# Patient Record
Sex: Male | Born: 1965
Health system: Southern US, Community
[De-identification: ages and names within clinical notes are randomized; demographics above are authoritative.]

## PROBLEM LIST (undated history)

## (undated) DIAGNOSIS — F10939 Alcohol use, unspecified with withdrawal, unspecified: Secondary | ICD-10-CM

## (undated) DIAGNOSIS — H9319 Tinnitus, unspecified ear: Secondary | ICD-10-CM

## (undated) DIAGNOSIS — F329 Major depressive disorder, single episode, unspecified: Secondary | ICD-10-CM

## (undated) DIAGNOSIS — F10239 Alcohol dependence with withdrawal, unspecified: Secondary | ICD-10-CM

## (undated) DIAGNOSIS — F1020 Alcohol dependence, uncomplicated: Secondary | ICD-10-CM

## (undated) DIAGNOSIS — K219 Gastro-esophageal reflux disease without esophagitis: Secondary | ICD-10-CM

## (undated) DIAGNOSIS — F419 Anxiety disorder, unspecified: Secondary | ICD-10-CM

## (undated) DIAGNOSIS — F32A Depression, unspecified: Secondary | ICD-10-CM

## (undated) DIAGNOSIS — R45851 Suicidal ideations: Secondary | ICD-10-CM

## (undated) DIAGNOSIS — N529 Male erectile dysfunction, unspecified: Secondary | ICD-10-CM

## (undated) HISTORY — DX: Gastro-esophageal reflux disease without esophagitis: K21.9

## (undated) HISTORY — DX: Male erectile dysfunction, unspecified: N52.9

## (undated) HISTORY — DX: Alcohol dependence, uncomplicated: F10.20

## (undated) HISTORY — PX: LEG SURGERY: SHX1003

## (undated) HISTORY — DX: Alcohol dependence with withdrawal, unspecified: F10.239

## (undated) HISTORY — DX: Suicidal ideations: R45.851

## (undated) HISTORY — DX: Tinnitus, unspecified ear: H93.19

## (undated) HISTORY — DX: Alcohol use, unspecified with withdrawal, unspecified: F10.939

## (undated) HISTORY — PX: ROTATOR CUFF REPAIR: SHX139

## (undated) HISTORY — DX: Depression, unspecified: F32.A

## (undated) HISTORY — DX: Anxiety disorder, unspecified: F41.9

---
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
## 2002-09-21 ENCOUNTER — Ambulatory Visit (HOSPITAL_BASED_OUTPATIENT_CLINIC_OR_DEPARTMENT_OTHER): Admission: RE | Admit: 2002-09-21 | Discharge: 2002-09-21 | Payer: Self-pay | Admitting: Plastic Surgery

## 2005-02-09 ENCOUNTER — Observation Stay (HOSPITAL_COMMUNITY): Admission: AC | Admit: 2005-02-09 | Discharge: 2005-02-10 | Payer: Self-pay

## 2006-06-17 IMAGING — RF DG TIBIA/FIBULA 2V*R*
1 series · 7 of 7 positions shown · non-contrast
Comparison: none

CLINICAL DATA: Open reduction and internal fixation of a tibia fracture.  Closed reduction of a fibula fracture.  
 INTRAOPERATIVE FLUOROSCOPIC VIEWS OF THE RIGHT TIBIA AND FIBULA:

[Series 1: run · 7 of 7 slices shown]
[im 1/7]
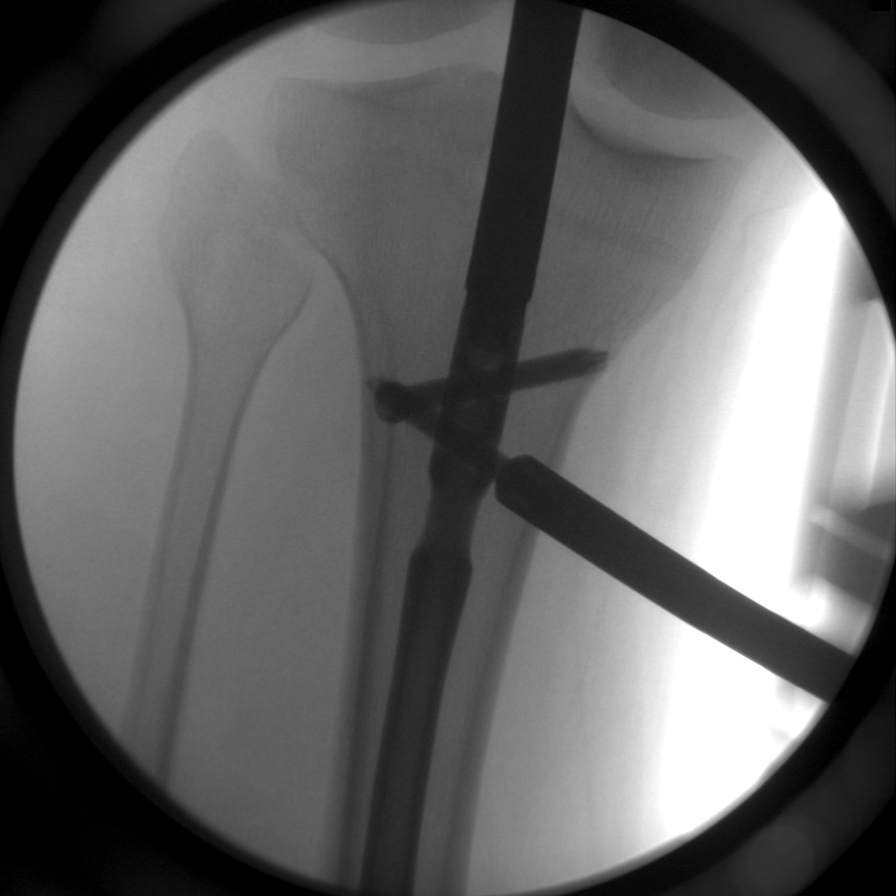
[im 2/7]
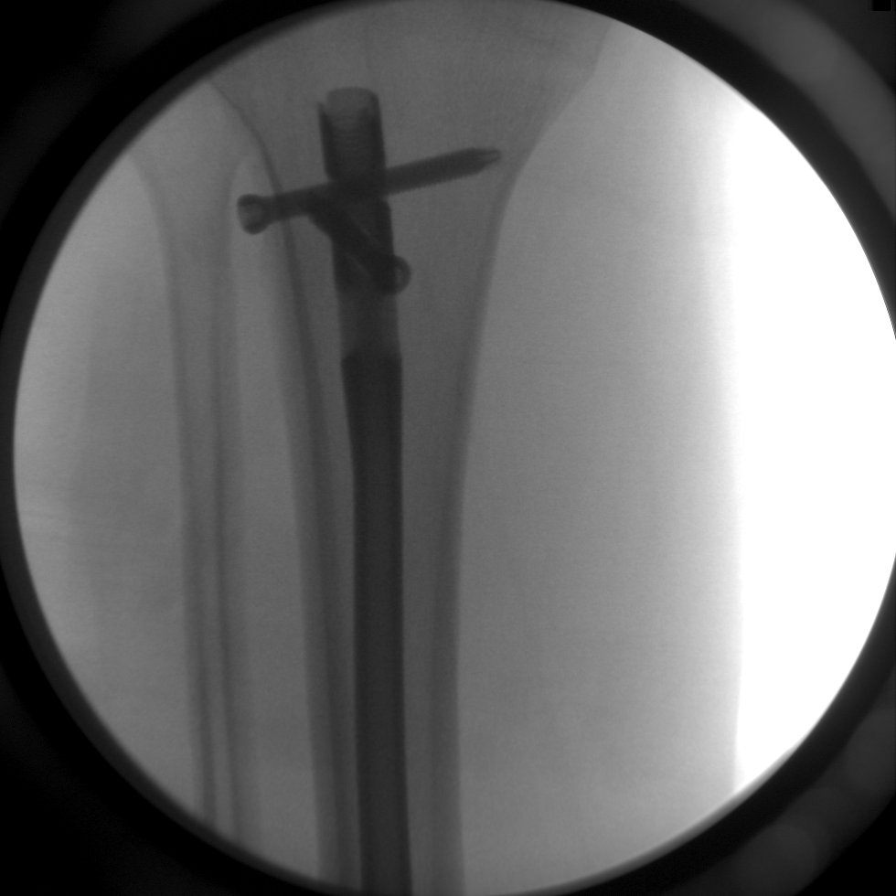
[im 3/7]
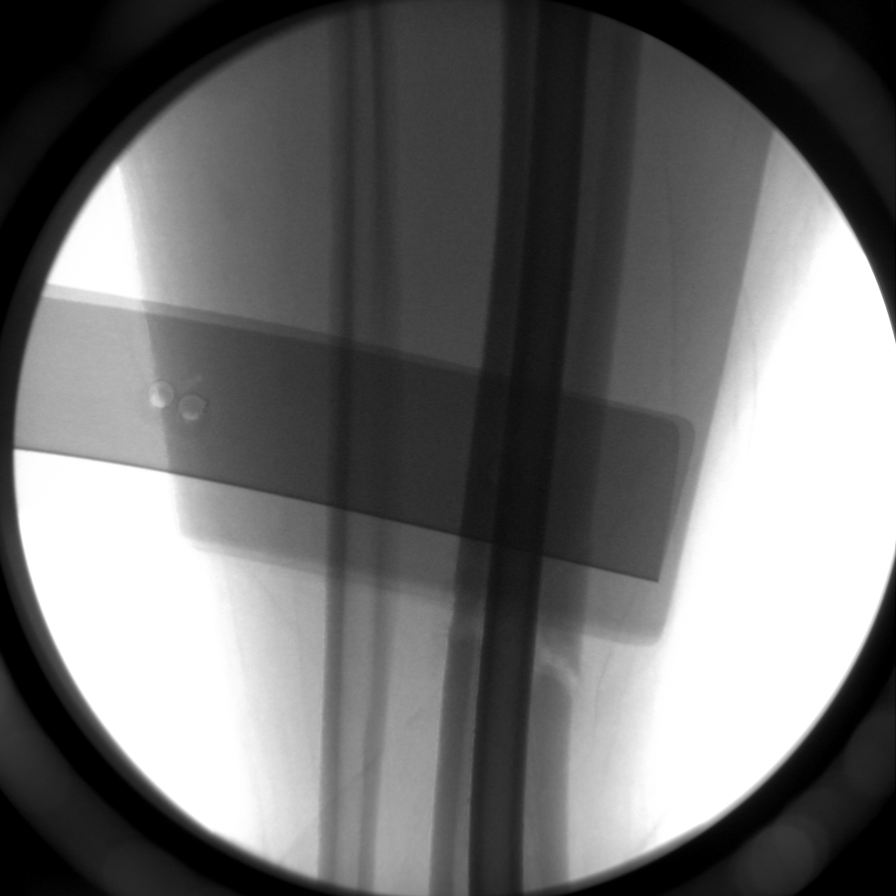
[im 4/7]
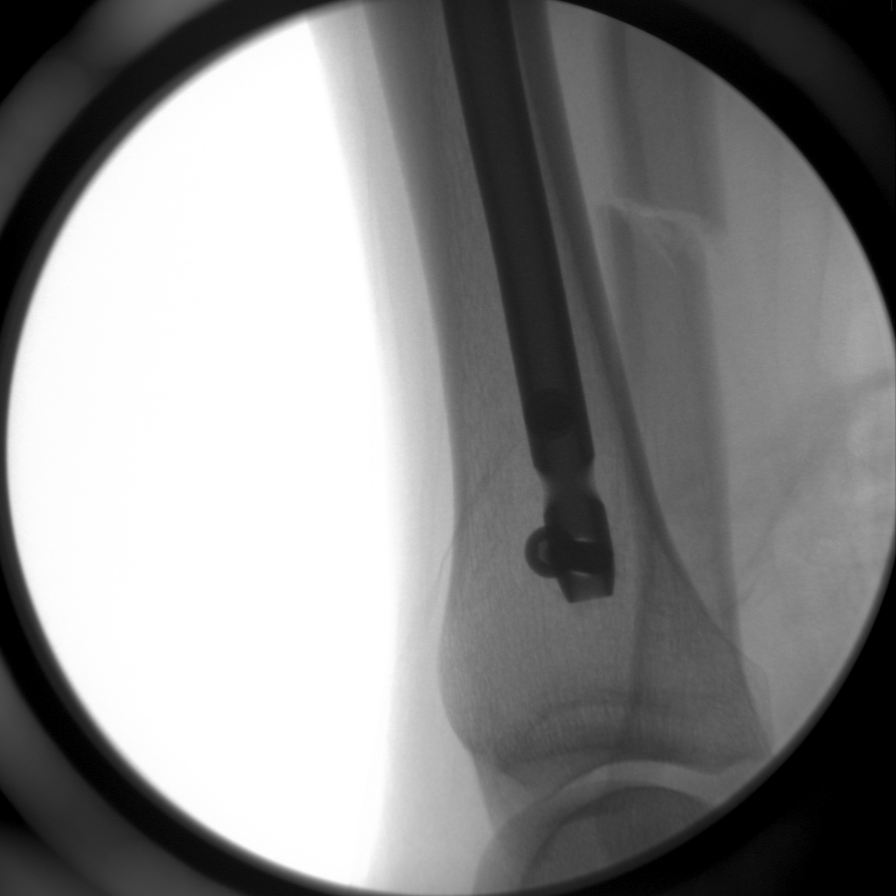
[im 5/7]
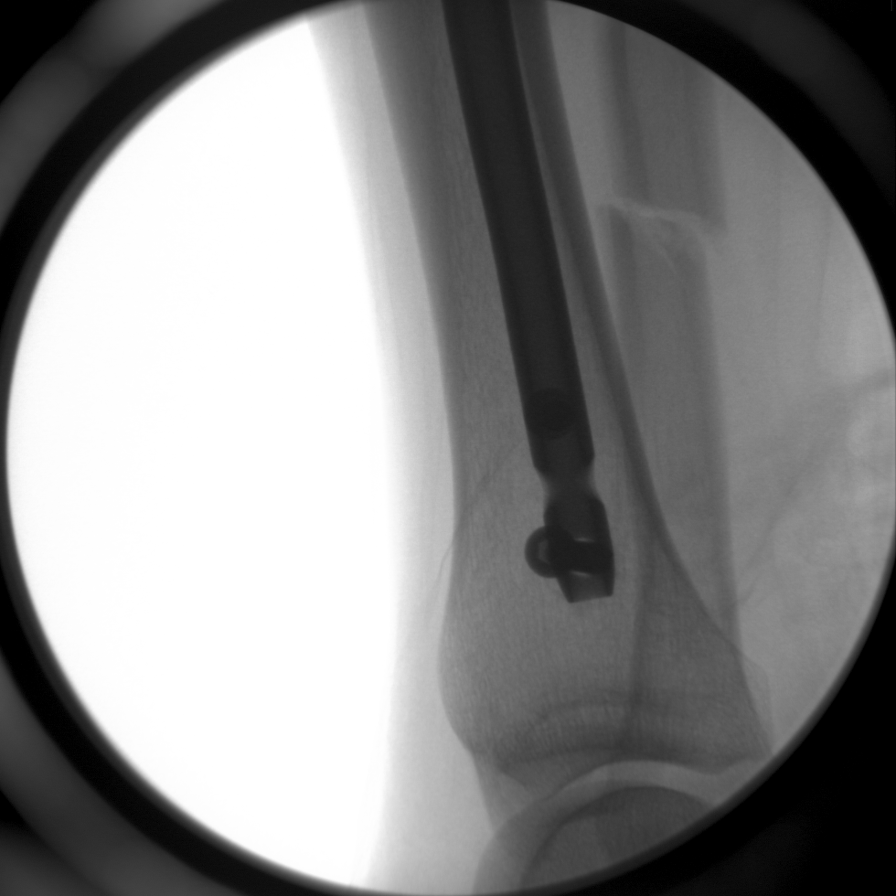
[im 6/7]
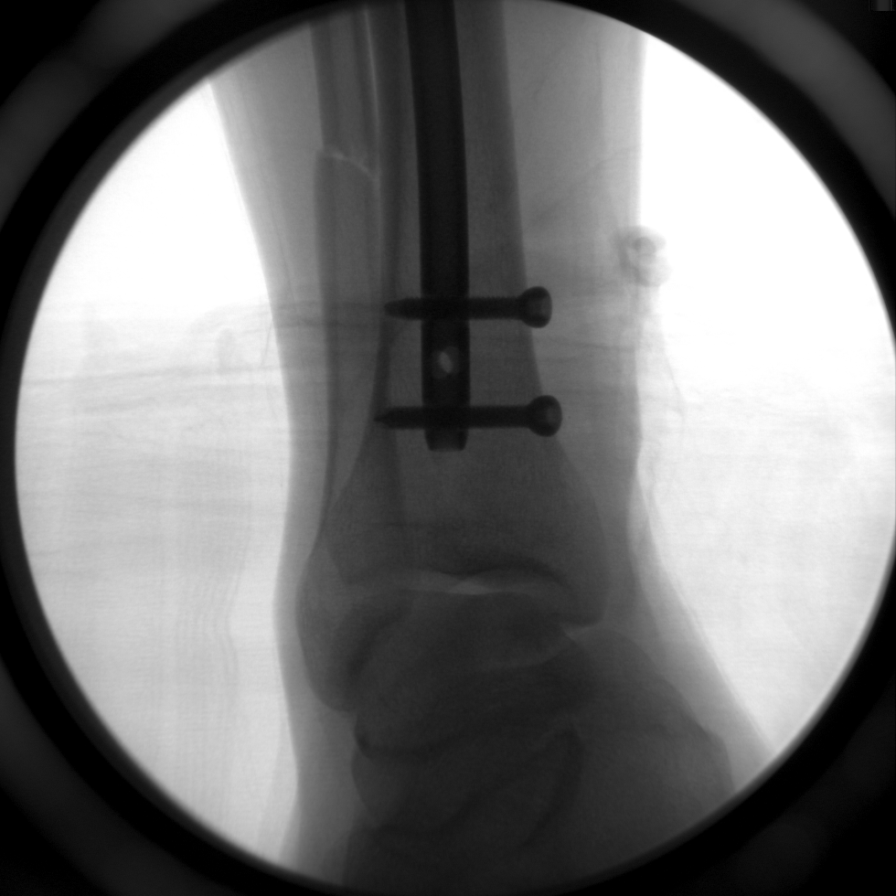
[im 7/7]
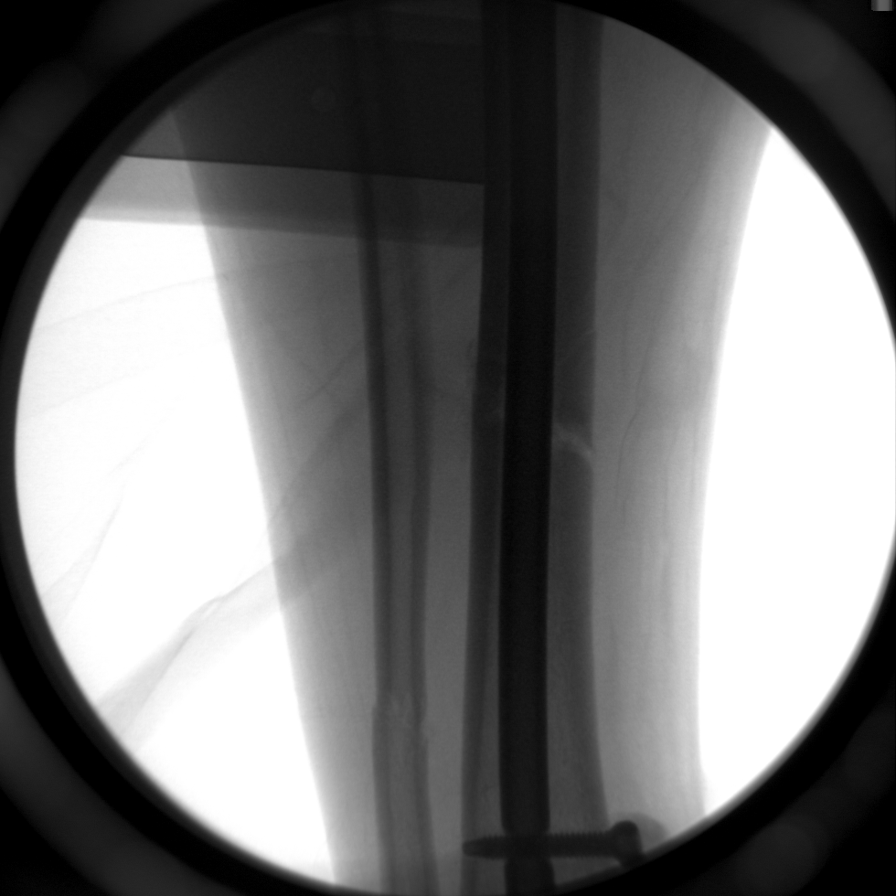

[7 of 7 positions shown; findings below may reference images not displayed]

FINDINGS: Right tibia intramedullary rod insertion is noted.  There are two proximal and two distal fixation screws.  The tibia fracture has been reduced with the IM rod.  There is also improved alignment and reduction of the fibula distal diaphysis fracture.
IMPRESSION: 1.  Status-post open reduction and internal fixation of a tibia shaft fracture with an IM rod. 
 2.  Improvement in the fibula fracture alignment.
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
## 2018-06-15 ENCOUNTER — Telehealth (HOSPITAL_COMMUNITY): Payer: Self-pay | Admitting: Psychiatry

## 2018-06-15 NOTE — Telephone Encounter (Signed)
D:  [facility] referred pt to MH-IOP.  Placed call to pt to orient him.  Pt declined stating that he just wanted someone to do a med check.  "I will contact my doctor and discuss this with him because this isn't what I wanted."  A:  Encouraged pt to call writer if he changes his mind in the future.  R:  Pt receptive.

## 2018-07-12 ENCOUNTER — Telehealth (HOSPITAL_COMMUNITY): Payer: Self-pay | Admitting: Licensed Clinical Social Worker

## 2018-07-12 NOTE — Telephone Encounter (Signed)
Called pt to inquire about his starting CD-IOP at this clinic. Pt was agreeable to this but stated his disability requires he attends a program that has a "behavioral health certified physician". Pt was advised to call [facility] to inquire about their IOP.

## 2019-08-09 ENCOUNTER — Encounter: Payer: Self-pay | Admitting: Internal Medicine

## 2019-08-09 ENCOUNTER — Ambulatory Visit: Payer: 59 | Admitting: Internal Medicine

## 2019-08-09 ENCOUNTER — Other Ambulatory Visit: Payer: Self-pay

## 2019-08-09 ENCOUNTER — Other Ambulatory Visit: Payer: 59

## 2019-08-09 VITALS — BP 142/98 | HR 92 | Temp 98.2°F | Ht 68.5 in | Wt 177.2 lb

## 2019-08-09 DIAGNOSIS — R1013 Epigastric pain: Secondary | ICD-10-CM

## 2019-08-09 DIAGNOSIS — F502 Bulimia nervosa: Secondary | ICD-10-CM

## 2019-08-09 DIAGNOSIS — K2920 Alcoholic gastritis without bleeding: Secondary | ICD-10-CM

## 2019-08-09 DIAGNOSIS — F411 Generalized anxiety disorder: Secondary | ICD-10-CM

## 2019-08-09 DIAGNOSIS — F1020 Alcohol dependence, uncomplicated: Secondary | ICD-10-CM

## 2019-08-09 MED ORDER — PANTOPRAZOLE SODIUM 40 MG PO TBEC: 40.0000 mg | DELAYED_RELEASE_TABLET | Freq: Every day | ORAL | 0 refills | Status: DC

## 2019-08-09 NOTE — Progress Notes (Signed)
Anthony [name] 54 y.o. [birth year] [id number]  Assessment & Plan:   Encounter Diagnoses  Name Primary?  . Dyspepsia Yes  . Alcoholic gastritis without bleeding, unspecified chronicity   . Alcoholism ([place])   . Anxiety state   . Bulimia nervosa?      I think his main problems are alcoholism and anxiety which are manifest today, probably responsible for his physical symptoms though not sure.  We talked about how to go about things and the plan was for him to get labs and to reestablish with an alcohol rehab program.  His labs in November were okay as long as they are stable I think this makes the most sense as withdrawal from his medications and drinking I think are most likely responsible for his symptoms.  Upper endoscopy, colonoscopy and cross-sectional imaging may be something we do sooner or later depending upon the clinical course.  He might have had pancreatitis when he was seen in November at [place] with the elevated lipase though it was only mildly so.   Regarding this question of bulimia that came out of the blue as we were discussing his need for alcohol rehab.  He says he cannot find anyone that we will treat both.  I explained that this is not in my area of expertise but I would inquire to see if we could find some help for him.  He very much needs to get alcohol rehab and detox.  In order to try to help with this I have given him a week off of work.  Unfortunately he went to the lab but left and did not have his blood work done.  Note he says his insurance requires him to have specific types of rehab programs and that is why he was at the [facility] last, apparently there needs to be a dedicated type of physician based upon what I can glean from chart review looking at his contact with [facility].   Subjective:   Chief Complaint: Abdominal burning bloating and diarrhea  HPI Anthony [name] is a 54 year old white man who complains of abdominal discomfort nausea "like a  bed nervous stomach that never goes away".  He has a past medical history of alcoholism and is concurrently drinking, anxiety and depression.  He stopped all of his medications around Thanksgiving and the symptoms began since then and he has been drinking to try to make it feel better.  He is struggling to sleep and cannot work due to this he tells me because he is pacing and he has abdominal pains.  He has been treated at the [facility] most recently, and says he would not go back there but will not elaborate.  He is not suicidal or homicidal.  He was seen in the emergency department in the [facility] system in November for high epigastric or chest pain and had negative cardiac work-up and he said that pain went away.  At that time I believe he was on his psychiatric medications.  Those medications included Lyrica Librium Silenor Wellbutrin generic, mirtazapine, Paxil, naltrexone, fluoxetine, Rexulti and omeprazole was started on 1116.  He denies any bleeding.  He has tried Pepto-Bismol and aspirin.  These have not helped either.  He was having some urgent bowel movements after eating.  Readily admits he is anxious.  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  Drinking "just beer".  Did not elaborate on how much.  Stopped his caffeinated beverages he says.  No prior gastrointestinal work-up that I am aware of.  Towards the end of the interview he also reports that he has bulimia   On November 16 he had an AST of 45 sodium 134 lipase 81 which was elevated chloride 95.White count was 12.6 normal H&H. No Known Allergies No outpatient medications have been marked as taking for the 08/09/19 encounter (Office Visit) with Iva Boop, MD.   Past Medical History:  Diagnosis Date  . Alcohol withdrawal syndrome (H[facility])   . Alcoholism ([facility])   . Anxiety   . Depression   . ED (erectile dysfunction)   . GERD (gastroesophageal reflux disease)   . Suicidal ideation   . Tinnitus    Past  Surgical History:  Procedure Laterality Date  . LEG SURGERY Right    broken leg  . ROTATOR CUFF REPAIR Bilateral    Social History   Social History Narrative   Patient is married he has a son born in 2010.   He is an Personnel officer at [employer]   He has alcoholism and is currently drinking   No tobacco or drug use   family history includes Diabetes in his mother.  No stomach or colon cancer Review of Systems As per HPI.  Otherwise negative.  Objective:   Physical Exam @BP  (!) 142/98 (BP Location: Left Arm, Patient Position: Sitting, Cuff Size: Normal)   Pulse 92   Temp 98.2 F (36.8 C)   Ht 5' 8.5" (1.74 m) Comment: height measured without shoes  Wt 177 lb 4 oz (80.4 kg)   BMI 26.56 kg/m @  General:  Well-developed, well-nourished and in no acute distress Eyes:  anicteric. ENT:   Mouth and posterior pharynx free of lesions.  Neck:   supple w/o thyromegaly or mass.  Lungs: Clear to auscultation bilaterally. Heart:  S1S2, no rubs, murmurs, gallops. Abdomen:  soft, non-tender, no hepatosplenomegaly, hernia, or mass and BS+.  Lymph:  no cervical or supraclavicular adenopathy. Extremities:   no edema, cyanosis or clubbing there are some bony deformities from previous fracture in the distal right leg near the ankle Skin   no rash.  No stigmata of chronic liver disease Neuro:  A&O x 3.  Psych:  Anxious with somewhat flat affect.  He is not suicidal or homicidal.  Data Reviewed:  See HPI and assessment and plan

## 2019-08-09 NOTE — Patient Instructions (Signed)
Your provider has requested that you go to the basement level for lab work before leaving today. Press "B" on the elevator. The lab is located at the first door on the left as you exit the elevator.   We have sent the following medications to your pharmacy for you to pick up at your convenience: Pantoprazole   Check with your insurance company about alcohol rehab places they cover.   We are writing you a work note to be excused for a week.    I appreciate the opportunity to care for you. Stan Head, MD, [facility]

## 2019-08-12 ENCOUNTER — Telehealth: Payer: Self-pay | Admitting: Internal Medicine

## 2019-08-12 ENCOUNTER — Encounter: Payer: Self-pay | Admitting: *Deleted

## 2019-08-12 DIAGNOSIS — F502 Bulimia nervosa, unspecified: Secondary | ICD-10-CM | POA: Insufficient documentation

## 2019-08-12 DIAGNOSIS — F411 Generalized anxiety disorder: Secondary | ICD-10-CM | POA: Insufficient documentation

## 2019-08-12 DIAGNOSIS — F1020 Alcohol dependence, uncomplicated: Secondary | ICD-10-CM | POA: Insufficient documentation

## 2019-08-12 NOTE — Telephone Encounter (Signed)
Dr. Leone Payor, please advise concerning the note the patient is requesting.

## 2019-08-12 NOTE — Telephone Encounter (Signed)
Letter sent to patient via MyChart.

## 2019-08-12 NOTE — Telephone Encounter (Signed)
Spoke to the patient who stated he would sign up for MyChart in order to be sent the release letter.

## 2019-08-12 NOTE — Telephone Encounter (Signed)
If he feels ready to go back to work ok to do the letter  He did not do labs as I recommended and he should still try to do them and to also seek help for his alcoholism, anxiety and depression as we discussed

## 2023-01-12 ENCOUNTER — Ambulatory Visit (HOSPITAL_COMMUNITY): Payer: Self-pay

## 2023-01-12 ENCOUNTER — Encounter (HOSPITAL_COMMUNITY): Payer: Self-pay

## 2023-01-12 ENCOUNTER — Ambulatory Visit (INDEPENDENT_AMBULATORY_CARE_PROVIDER_SITE_OTHER): Payer: 59

## 2023-01-12 ENCOUNTER — Ambulatory Visit (HOSPITAL_COMMUNITY): Payer: 59

## 2023-01-12 DIAGNOSIS — F39 Unspecified mood [affective] disorder: Secondary | ICD-10-CM

## 2023-01-12 DIAGNOSIS — F1020 Alcohol dependence, uncomplicated: Secondary | ICD-10-CM

## 2023-01-12 NOTE — Progress Notes (Signed)
Comprehensive Clinical Assessment (CCA) Note  01/12/2023 Anthony [name] [id number]  Chief Complaint:  Chief Complaint  Patient presents with   Drug / Alcohol Assessment   Visit Diagnosis: Alcohol Use, Severe and Unspecified Affective Disorder    CCA Screening, Triage and Referral (STR)  Patient Reported Information How did you hear about [place]? No data recorded Referral name: [facility]  Referral phone number: No data recorded  Whom do you see for routine medical problems? Primary Care  Practice/Facility Name: [facility]  Practice/Facility Phone Number: No data recorded Name of Contact: No data recorded Contact Number: No data recorded Contact Fax Number: No data recorded Prescriber Name: No data recorded Prescriber Address (if known): No data recorded  What Is the Reason for Your Visit/Call Today? assessment in advance of starting [facility] IOP  How Long Has This Been Causing You Problems? > than 6 months  What Do You Feel Would Help You the Most Today? Alcohol or Drug Use Treatment   Have You Recently Been in Any Inpatient Treatment (Hospital/Detox/Crisis Center/28-Day Program)? Yes  Name/Location of Program/Hospital:[facility]  How Long Were You There? 7 days  When Were You Discharged? 01/07/23   Have You Ever Received Services From [facility] [facility] Before? Yes  Who Do You See at [facility]? No data recorded  Have You Recently Had Any Thoughts About Hurting Yourself? No  Are You Planning to Commit Suicide/Harm Yourself At This time? No   Have you Recently Had Thoughts About Hurting Someone Karolee Ohs? No  Explanation: No data recorded  Have You Used Any Alcohol or Drugs in the Past 24 Hours? No  How Long Ago Did You Use Drugs or Alcohol? No data recorded What Did You Use and How Much? No data recorded  Do You Currently Have a Therapist/Psychiatrist? No  Name of Therapist/Psychiatrist: No data recorded  Have You Been Recently Discharged From Any Office  Practice or Programs? No  Explanation of Discharge From Practice/Program: No data recorded    CCA Screening Triage Referral Assessment Type of Contact: Face-to-Face  Is this Initial or Reassessment? No data recorded Date Telepsych consult ordered in [facility]:  No data recorded Time Telepsych consult ordered in [facility]:  No data recorded  Patient Reported Information Reviewed? No data recorded Patient Left Without Being Seen? No data recorded Reason for Not Completing Assessment: No data recorded  Collateral Involvement: No data recorded  Does Patient Have a Court Appointed Legal Guardian? No data recorded Name and Contact of Legal Guardian: No data recorded If Minor and Not Living with Parent(s), Who has Custody? No data recorded Is CPS involved or ever been involved? Never  Is APS involved or ever been involved? Never   Patient Determined To Be At Risk for Harm To Self or Others Based on Review of Patient Reported Information or Presenting Complaint? No  Method: No data recorded Availability of Means: No data recorded Intent: No data recorded Notification Required: No data recorded Additional Information for Danger to Others Potential: No data recorded Additional Comments for Danger to Others Potential: No data recorded Are There Guns or Other Weapons in Your Home? No  Types of Guns/Weapons: No data recorded Are These Weapons Safely Secured?                            No data recorded Who Could Verify You Are Able To Have These Secured: No data recorded Do You Have any Outstanding Charges, Pending Court  Dates, Parole/Probation? No data recorded Contacted To Inform of Risk of Harm To Self or Others: No data recorded  Location of Assessment: [facility] Services   Does Patient Present under Involuntary Commitment? No  IVC Papers Initial File Date: No data recorded  [place] of Residence: [place]   Patient Currently Receiving the Following Services: No data  recorded  Determination of Need: Routine (7 days)   Options For Referral: Chemical Dependency Intensive Outpatient Therapy (CDIOP)     CCA Biopsychosocial Intake/Chief Complaint:  alcohol use recently detoxed. Wanting to attend IOP  Current Symptoms/Problems: mild depression and moderate anxiety.  Craving of Alcohol every couple of days   Patient Reported Schizophrenia/Schizoaffective Diagnosis in Past: No   Strengths: good Dad, honest person  Preferences: No data recorded Abilities: No data recorded  Type of Services Patient Feels are Needed: No data recorded  Initial Clinical Notes/Concerns: No data recorded  Mental Health Symptoms Depression:   -- (see PHQ-9)   Duration of Depressive symptoms: No data recorded  Mania:   None   Anxiety:    -- (See GAD-7)   Psychosis:   None   Duration of Psychotic symptoms: No data recorded  Trauma:   N/A   Obsessions:   N/A   Compulsions:   N/A   Inattention:   N/A   Hyperactivity/Impulsivity:   N/A   Oppositional/Defiant Behaviors:   None   Emotional Irregularity:   N/A   Other Mood/Personality Symptoms:  No data recorded   Mental Status Exam Appearance and self-care  Stature:   Average (5'9")   Weight:   Average weight   Clothing:   Casual   Grooming:   Normal   Cosmetic use:   None   Posture/gait:   Normal   Motor activity:   Not Remarkable   Sensorium  Attention:   Normal   Concentration:   Normal   Orientation:   X5   Recall/memory:   Normal   Affect and Mood  Affect:   Appropriate   Mood:   Other (Comment) (mildly despressed and moderately anxious)   Relating  Eye contact:   Normal   Facial expression:   Responsive   Attitude toward examiner:   Cooperative   Thought and Language  Speech flow:  Clear and Coherent   Thought content:   Appropriate to Mood and Circumstances   Preoccupation:   None   Hallucinations:   None   Organization:  No data  recorded  [employer] of Knowledge:  No data recorded  Intelligence:  No data recorded  Abstraction:   Abstract   Judgement:   Good   Reality Testing:   Adequate   Insight:   Good   Decision Making:   Normal   Social Functioning  Social Maturity:  No data recorded  Social Judgement:  No data recorded  Stress  Stressors:   Other (Comment) (wife is at her "wits end")   Coping Ability:   Normal   Skill Deficits:   None   Supports:   Family (spouse and sister)     Religion: Religion/Spirituality Are You A Religious Person?: No  Leisure/Recreation: Leisure / Recreation Do You Have Hobbies?: Yes Leisure and Hobbies: working on cars  Exercise/Diet: Exercise/Diet Do You Exercise?: Yes What Type of Exercise Do You Do?: Hiking, Run/Walk How Many Times a Week Do You Exercise?: 1-3 times a week Have You Gained or Lost A Significant Amount of Weight in the Past Six Months?: Yes-Lost  Number of Pounds Lost?: 12 Do You Follow a Special Diet?: No Do You Have Any Trouble Sleeping?: Yes Explanation of Sleeping Difficulties: Difficulty staying asleep averaging 5-6 hours   CCA Employment/Education Employment/Work Situation: Employment / Work Situation Employment Situation: Employed 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 Drinking affected his ability to go in to work What is the Longest Time Patient has Held a Job?: 35 yrs Where was the Patient Employed at that Time?: current employer Has Patient ever Been in the [employer]?: No  Education: Education Is Patient Currently Attending School?: No Last Grade Completed: 14 Name of High School: [employer] Did Garment/textile technologist From [employer]?: Yes Did Theme park manager?: Yes What Type of College Degree Do you Have?: Associates degree  from [employer] in Chief Strategy Officer Did You Attend Graduate School?: No Did You Have An Individualized Education Program (IIEP): No Did You Have Any Difficulty At [employer]?: No (GPA 3.4) Patient's Education Has Been Impacted by Current Illness: No   CCA Family/Childhood History Family and Relationship History: Family history Marital status: Married Number of Years Married: 14 What types of issues is patient dealing with in the relationship?: wife and 39 yr old son do not want him drinking Are you sexually active?: Yes What is your sexual orientation?: heterosexual Has your sexual activity been affected by drugs, alcohol, medication, or emotional stress?: no Does patient have children?: Yes How many children?: 3 How is patient's relationship with their children?: ages 110, 92, and 43. Good relationship with kids  Childhood History:  Childhood History By whom was/is the patient raised?: Mother Additional childhood history information: grew up in [place]. Parents split when he was 14. Dad and paternal uncles had alcohol problems Description of patient's relationship with caregiver when they were a child: good Patient's description of current relationship with people who raised him/her: good How were you disciplined when you got in trouble as a child/adolescent?: belt or switch Does patient have siblings?: Yes Number of Siblings: 1 (sister is 1.5 yrs younger) Description of patient's current relationship with siblings: good Did patient suffer any verbal/emotional/physical/sexual abuse as a child?: No Did patient suffer from severe childhood neglect?: No Has patient ever been sexually abused/assaulted/raped as an adolescent or adult?: No Was the patient ever a victim of a crime or a disaster?: No Witnessed domestic violence?:  No Has patient been affected by domestic violence as an adult?: No  Child/Adolescent Assessment:     CCA Substance Use Alcohol/Drug Use: Alcohol / Drug Use Pain Medications: none Prescriptions: none Over the Counter: multi vitamin History of alcohol / drug use?: Yes Longest period of sobriety (when/how long): 9 months two years ago. went to a residential program for 30 days at [facility]. Did aftercare 12 step meetings. No sponsor Negative Consequences of Use: Financial, Armed forces operational officer, Personal relationships, Work / School (2 DWI's. Last one was 2007) Withdrawal Symptoms: Tremors, Agitation, Anorexia, Change in blood pressure, Irritability, Sweats, Tachycardia Substance #1 Name of Substance 1: Alcohol 1 - Age of First Use: 21 1 - Amount (size/oz): 18 beers 1 - Frequency: daily 1 - Duration: 20 years 1 - Last Use / Amount: 12-31-22 1 - Method of  Aquiring: legal 1- Route of Use: oral                       ASAM's:  Six Dimensions of Multidimensional Assessment  Dimension 1:  Acute Intoxication and/or Withdrawal Potential:   Dimension 1:  Description of individual's past and current experiences of substance use and withdrawal: no risk of acute withdrawal  Dimension 2:  Biomedical Conditions and Complications:   Dimension 2:  Description of patient's biomedical conditions and  complications: no medical problems reported  Dimension 3:  Emotional, Behavioral, or Cognitive Conditions and Complications:  Dimension 3:  Description of emotional, behavioral, or cognitive conditions and complications: PHQ- 9 is a seven.  GAD-7 is an eleven  Dimension 4:  Readiness to Change:  Dimension 4:  Description of Readiness to Change criteria: rates desire to quit drinking as a 10 on a scale of 1-10 with 10 being most motivated; Says that his 41 yr old son wants him to stop drinking  Dimension 5:  Relapse, Continued use, or Continued Problem Potential:  Dimension 5:  Relapse, continued use, or  continued problem potential critiera description: daily drinking x 20 years. Longest sobriety has been 9 months  Dimension 6:  Recovery/Living Environment:  Dimension 6:  Recovery/Iiving environment criteria description: no alcohol use in the family.  No triggers in his living environment  ASAM Severity Score: ASAM's Severity Rating Score: 5  ASAM Recommended Level of Treatment: ASAM Recommended Level of Treatment: Level II Intensive Outpatient Treatment   Substance use Disorder (SUD) Substance Use Disorder (SUD)  Checklist Symptoms of Substance Use: Evidence of withdrawal (Comment), Evidence of tolerance, Continued use despite having a persistent/recurrent physical/psychological problem caused/exacerbated by use, Continued use despite persistent or recurrent social, interpersonal problems, caused or exacerbated by use, Presence of craving or strong urge to use, Persistent desire or unsuccessful efforts to cut down or control use, Large amounts of time spent to obtain, use or recover from the substance(s), Recurrent use that results in a failure to fulfill major role obligations (work, school, home), Repeated use in physically hazardous situations, Social, occupational, recreational activities given up or reduced due to use, Substance(s) often taken in larger amounts or over longer times than was intended  Recommendations for Services/Supports/Treatments: Recommendations for Services/Supports/Treatments Recommendations For Services/Supports/Treatments: CD-IOP Intensive Chemical Dependency Program  DSM5 Diagnoses: Patient Active Problem List   Diagnosis Date Noted   Alcoholism (HCC) 08/12/2019   Anxiety state 08/12/2019   Bulimia nervosa? 08/12/2019    Patient Centered Plan: Patient is on the following Treatment Plan(s):  Substance Abuse   Referrals to Alternative Service(s): Referred to Alternative Service(s):   Place:   Date:   Time:    Referred to Alternative Service(s):   Place:   Date:    Time:    Referred to Alternative Service(s):   Place:   Date:   Time:    Referred to Alternative Service(s):   Place:   Date:   Time:      Collaboration of Care: Other N/A  Patient/Guardian was advised Release of Information must be obtained prior to any record release in order to collaborate their care with an outside provider. Patient/Guardian was advised if they have not already done so to contact the registration department to sign all necessary forms in order for [place] to release information regarding their care.   Consent: Patient/Guardian gives verbal consent for treatment and assignment of benefits for services provided during this visit. Patient/Guardian expressed understanding and  agreed to proceed.   Plan: Bill plans on attending [facility] on 01-14-23.  [name], LCAS 01-12-2023

## 2023-01-14 ENCOUNTER — Ambulatory Visit (INDEPENDENT_AMBULATORY_CARE_PROVIDER_SITE_OTHER): Payer: 59

## 2023-01-14 VITALS — BP 138/88 | HR 53 | Ht 68.94 in | Wt 166.4 lb

## 2023-01-14 DIAGNOSIS — F39 Unspecified mood [affective] disorder: Secondary | ICD-10-CM

## 2023-01-14 DIAGNOSIS — Z566 Other physical and mental strain related to work: Secondary | ICD-10-CM

## 2023-01-14 DIAGNOSIS — Z811 Family history of alcohol abuse and dependence: Secondary | ICD-10-CM

## 2023-01-14 DIAGNOSIS — F4312 Post-traumatic stress disorder, chronic: Secondary | ICD-10-CM

## 2023-01-14 DIAGNOSIS — F418 Other specified anxiety disorders: Secondary | ICD-10-CM

## 2023-01-14 DIAGNOSIS — T1490XA Injury, unspecified, initial encounter: Secondary | ICD-10-CM

## 2023-01-14 DIAGNOSIS — F1020 Alcohol dependence, uncomplicated: Secondary | ICD-10-CM | POA: Diagnosis not present

## 2023-01-14 NOTE — Progress Notes (Signed)
Topic: Daily Group Progress Note   Program: [facility] IOP     Group Time: 9 a.m. to 12 p.m.    Type of Therapy: Process and Psychoeducational    Topic: The therapist checks in with group members, assesses for SI/HI/psychosis and overall level of functioning. The therapist inquires about sobriety date and number of community support meetings attended since last session.    Therapist introduces three new members to the group.  Therapist explains to the new group members the process of checking in each group session and exactly what this involves.  Therapists asks the group member who has been in group the longest if she would share her story on how she got into treatment and her journey while in IOP.  Therapist discusses some of the overall topics that will be covered in CD IOP, particularly focusing on early recovery as this applies to the three new group members, including Post Acute Withdrawal Syndrome (pink clouding,  interrupted sleep patterns, boredom, mood and anxiety symptoms, relapse.  Therapist facilitates a discussion among group members that have already been attending about these issues.  Therapist discussed briefly the role of dopamine in neurobiology as group members were expressing shame issues with slips and relapses attributing them to character defects, rather than a disease.  Therapist discusses the issue of meetings and choosing sponsors as most members are struggling with one or both of these issues.  Therapist utilizes humor throughout the group to attempt to keep new members attention, as they recently have been in detox and are struggling to maintain attention spans.      Summary:  Anthony [name] rates his depression as a "2" and his anxiety as a "3".  He states his two emotions today are "tired and hopeful".  Anthony [name] says he had a drinking dream which upset him.  He says he is hopeful things will get better in time.  Anthony [name] said he and his son went this past weekend to [place] to a [employer].  Anthony [name]  said he spent time catching up with his son.  Anthony [name] said his son is interested in getting a four wheeler.  Anthony [name] responds to therapist discussion of Post Acute Withdrawal Phase and says he has never heard of this.  When asked what his take away for today is, He says "post symptoms after detox" as he has never heard of this previously.        Progress Towards Goals: Anthony [name] reports no drinking since before entering detox last week.   UDS collected: Yes  Results: No   AA/NA attended?: no   Sponsor?: No   Myrna Blazer, MA, LCSW, LCMHC, LCAS [name], MS, LMFT, [place] 01-14-2023
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
## 2023-01-16 ENCOUNTER — Other Ambulatory Visit (HOSPITAL_COMMUNITY): Payer: Self-pay | Admitting: Medical

## 2023-01-16 ENCOUNTER — Ambulatory Visit (INDEPENDENT_AMBULATORY_CARE_PROVIDER_SITE_OTHER): Payer: 59

## 2023-01-16 DIAGNOSIS — F1020 Alcohol dependence, uncomplicated: Secondary | ICD-10-CM

## 2023-01-16 DIAGNOSIS — F39 Unspecified mood [affective] disorder: Secondary | ICD-10-CM

## 2023-01-16 MED ORDER — BACLOFEN 10 MG PO TABS: 10.0000 mg | ORAL_TABLET | Freq: Three times a day (TID) | ORAL | 1 refills | Status: DC

## 2023-01-16 MED ORDER — BUPROPION HCL ER (SR) 100 MG PO TB12: 100.0000 mg | ORAL_TABLET | Freq: Two times a day (BID) | ORAL | 2 refills | Status: DC

## 2023-01-16 NOTE — Progress Notes (Signed)
Daily Group Progress Note   Program: CD IOP   Group Time: 9 a.m. to 12:00 p.m.   Type of Therapy: Process and Psychoeducational   Topic: The therapist checks in with group members, assesses for SI/HI/psychosis and overall level of functioning. The therapist inquires about sobriety date and number of community support meetings attended since last session.   Therapist introduces new group member today. Therapist discusses the elements of CD-IOP, those being group, individual and family therapy.  Therapist educates group members that addiction is not just an individual's disease, rather it affects the entire family system. Therapist encourages those with family issues to invite their families to make an appointment.  Therapist facilitates a discussion of triggers to using and discussion was held about the trigger of money, using an example of asking to be paid with a check versus cash. Therapist prompted discussion on how to build as many barriers as possible to prevent use of drugs and alcohol.   Therapist discussed how people in active addiction can be a sabatage  to one's recovery, as the addicts, themselves are threatened about former using associates being in recovery, as they may have to entertain they have a problem.    Summary: Anthony [name] rates his depression as a "1" and his anxiety as a "1". Sobriety date of 12-31-22.  He says he just got out of detox.He reports he has not attended any AA meetings yet, so he does not have a sponsor. Anthony [name] states he is using his time to try to make amends to his son. Bill reports he will try to attend AA next week. Anthony [name] says his take away from the group today is to "commit to the course".  Therapist encourages Anthony [name] to attend AA meetings and obtain a sponsor.  Therapist observes Anthony [name] is quiet and attentive but shared the least of other members.  Progress Towards Goals: reports no drinking   UDS collected: No Results: no   AA/NA attended?: No   Sponsor?: No   Myrna Blazer, MA, LCSW, LCMHC, LCAS [name], MS, LMFT, LCAS   01/16/2023

## 2023-01-16 NOTE — Progress Notes (Unsigned)
Rx meds for AUD Severe Dependence and PTSD with chronic Dysthymia/Anxious Depression

## 2023-01-19 ENCOUNTER — Ambulatory Visit (INDEPENDENT_AMBULATORY_CARE_PROVIDER_SITE_OTHER): Payer: 59

## 2023-01-19 DIAGNOSIS — F1020 Alcohol dependence, uncomplicated: Secondary | ICD-10-CM | POA: Diagnosis not present

## 2023-01-19 DIAGNOSIS — F39 Unspecified mood [affective] disorder: Secondary | ICD-10-CM

## 2023-01-19 NOTE — Progress Notes (Signed)
  Daily Group Progress Note   Program: CD IOP   Group Time: 9 a.m. to 12:00 p.m.   Type of Therapy: Process and Psychoeducational   Topic: The therapist checks in with group members, assesses for SI/HI/psychosis and overall level of functioning. The therapist inquires about sobriety date and number of community support meetings attended since last session.   Therapist uses the [employer] module chapter that delineates 12 step work and facilitates discussion, while explaining how being in "self will" can get a person in recovery stuck. Therapist additionally discusses how over generalizing can enable folks to talk them out of doing things they may should be acting on.   Therapist distributes Narcotics Anonymous Step Working Geophysicist/field seismologist.  Therapist facilitates conversation after therapist asks group members to peruse the material to see if they had any specific questions.  In response to some group members sharing their hesitancy about 12 step work,  therapist invited group members to take the material home and use it as a guide to begin doing 12 step work, as this material provides guidance on how to address the steps by providing specific questions that pertain to each step. Therapist presents on a module in the Matrix Model on Early Recovery , entitled "Five Common Challenges in Early Recovery.  The Five common challenges  includes the following:  1) Friends and Associates who continue to use, 2) Anger and Irritability, 3) Substances in the home, 4) Boredom and loneliness and 5) Special Occasions.  Summary:   [name] rates his depression as a "1" and his anxiety as a "1". He says his two emotions for today are "disappointed and embarrassed.   Bill reports the same sobriety date. [name] says he had a boring weekend.  [name] says he has not been to a meeting  and does not have a sponsor.  [name] says he does not have any excuses. [name] says he hung out with his kids.  He says he will try to get to meetings this  week.   Progress Towards Goals: Reports no use of alcohol   UDS collected: Yes  Results: no   AA/NA attended?: no   Sponsor?: no   Myrna Blazer, MA, [place], [facility], LCAS [name], MS, LMFT, LCAS   01/19/2023   Individual:  Therapist meets with Bill after group to complete [place] Disability Paperwork.  Therapist discusses that [name] does not appear to be very motivated as he is rather quiet in group.  [name] explained he is a quiet person.  He says that his wife will not go to [place] and [name] does not think she understands addiction. Therapist asked [name] to talk to his wife and feel free to schedule a family session.  Myrna Blazer, MA, LCSW, LCMHC, LCAS [name], MS, LMFT, LCAS   01/19/2023

## 2023-01-21 ENCOUNTER — Ambulatory Visit (INDEPENDENT_AMBULATORY_CARE_PROVIDER_SITE_OTHER): Payer: 59

## 2023-01-21 DIAGNOSIS — Z811 Family history of alcohol abuse and dependence: Secondary | ICD-10-CM

## 2023-01-21 DIAGNOSIS — F39 Unspecified mood [affective] disorder: Secondary | ICD-10-CM

## 2023-01-21 DIAGNOSIS — F418 Other specified anxiety disorders: Secondary | ICD-10-CM

## 2023-01-21 DIAGNOSIS — F341 Dysthymic disorder: Secondary | ICD-10-CM

## 2023-01-21 DIAGNOSIS — Z6372 Alcoholism and drug addiction in family: Secondary | ICD-10-CM

## 2023-01-21 DIAGNOSIS — F1020 Alcohol dependence, uncomplicated: Secondary | ICD-10-CM | POA: Diagnosis not present

## 2023-01-21 NOTE — Progress Notes (Addendum)
Daily Group Progress Note   Program: CD IOP   Group Time: 9 a.m. to 12:00 p.m.   Type of Therapy: Process and Psychoeducational     The therapists check in with group members, assess for SI/HI/psychosis and overall level of functioning. The therapists inquire about sobriety date and number of community support meetings attended since last session.   The therapists introduce a new group member and facilitate discussions on the reasons that people hide their substance use and how this can be part of their using ritual. Additionally, the therapists explain how hiding one's use from others is part of denial in that it suggests that others carry a responsibility for the person's disease when they do not. Also, family members' well-intentioned efforts can also imply this as well preventing the person in active addiction from bearing and feeling the full weight of his or her disease.   The therapists observe that people in active addiction tend to underestimate the impact of their using on loved ones in their lives. The therapist points out that persons in active addiction who have children need to recognize the negative emotional impact their using has on their kids and how modeling using behavior can lead to these kids engaging in addictive behavior as well. Thus, the therapist concludes that people in recovery who have children can use the well-being of their children as a motivation to not use.   Again, the therapists emphasize the need to be smart versus strong, meaning that people must avoid triggers i.e. people, places, and things or will continue to relapse.   Summary: Anthony [name] rates his depression as a "2" and his anxiety as a "1". He identifies his emotions today as "excited and fearful".  Anthony [name] reports his sobriety date is the same. Anthony [name] shares he attended his first meeting in "quite a while".  He says there were reminders of reasons not to drink.  Anthony [name] shares he is fearful. He says his family is  going to [place] this weekend and wonders if he is up to facing possible temptations to drink.  Anthony [name] acknowledges he could slip up. Anthony [name] says he wants to visit several more meetings to find out the possibilities of being a sponsor.  Therapist asks if he has asked his wife to come in for family therapy.  He reports his wife has "to draw" with other staff about time off so she is never sure if it will be granted.  Anthony [name] says his wife works at [employer].  Therapist offered to meet with Anthony [name] and his wife if she could come during or around the lunch hour.  Anthony [name] says he will ask his wife.    Progress Towards Goals:  reports no use of alcohol or drugs  UDS collected: No  Results: Negative for Alcohol and drugs   AA/NA attended?: Yes   Sponsor?: No  [name], MS, LMFT, [address] [place], [place], [place], [facility], LCAS 01-21-2023

## 2023-01-23 ENCOUNTER — Ambulatory Visit (HOSPITAL_COMMUNITY): Payer: 59

## 2023-01-23 ENCOUNTER — Encounter (HOSPITAL_COMMUNITY): Payer: Self-pay

## 2023-01-26 ENCOUNTER — Ambulatory Visit (INDEPENDENT_AMBULATORY_CARE_PROVIDER_SITE_OTHER): Payer: 59

## 2023-01-26 DIAGNOSIS — F39 Unspecified mood [affective] disorder: Secondary | ICD-10-CM

## 2023-01-26 DIAGNOSIS — F1020 Alcohol dependence, uncomplicated: Secondary | ICD-10-CM | POA: Diagnosis not present

## 2023-01-26 NOTE — Progress Notes (Addendum)
Daily Group Progress Note   Program: CD IOP   Group Time: 9 a.m. to 12:00 p.m.   Type of Therapy: Process and Psychoeducational     The therapists check in with group members, assess for SI/HI/psychosis and overall level of functioning. The therapists inquire about sobriety date and number of community support meetings attended since last session.   The therapists check in with group members, assess for SI/HI/psychosis and overall level of functioning. The therapists inquire about sobriety date and number of community support meetings attended since last session.  The therapists discuss how feeling shame and guilt over using is part of the addiction cycle and counterproductive. The therapists encourage group members to instead be solution-focused coming up with a relapse prevention plan aimed at determining how they will remove triggers and/or respond differently when confronted with the same triggers. The therapists point out that members can find meetings in the middle of the night, on-line in different time zones and in different countries. The therapists also explain how one should not wrestle with a thought or urge to drink in isolation but should tell on one's self via calling someone in the program or one's Sponsor to disclose that he or she is thinking of drinking. The therapists agree with one group member's suggestion that using relaxation, guided imagery audios prior to bedtime can help improve sleep while noting other sleep hygiene techniques.  The therapists begin showing the video, "Breaking the Addiction Cycle" with Delsa Sale having them complete the first exercise in the video concerning family attitudes about alcohol and drugs and concerning what they thought an alcoholic or addict looked like when they were children.    Summary: Anthony [name] rates his depression as a "1" and his anxiety as a "1".   Anthony [name]  identifies his emotions as "Thankful and Optimistic".  Anthony [name] says he has the same  sobriety date. Anthony [name] shares that he and his family went to the mountains for the weekend and had an enjoyable time.  He says he saw "a lot" of moonshine in the mountains, however states it did not trigger him.   Anthony [name] shares that he and his family went on several tours.  Anthony [name] shares with the group that he was triggered by a big glass case of cold beer in the hotel in which they were staying.  Anthony [name] says he was able to not drink.  He shares that it is more difficult to abstain when you are out of detox and residential and then "own your own".  In response to the movie, and the assignment of asking how alcohol and drugs were accepted in one's family system, Anthony [name] says that alcohol was not accepted by his mother and drugs also were not accepted.  Progress Towards Goals:  reports no use of alcohol or drugs  UDS collected: Yes  Results: Negative   AA/NA attended?: No   Sponsor?: No  [name], MS, LMFT, [address] [place], [place], [place], [facility], LCAS 01-26-2023

## 2023-01-28 ENCOUNTER — Ambulatory Visit (INDEPENDENT_AMBULATORY_CARE_PROVIDER_SITE_OTHER): Payer: 59 | Admitting: Licensed Clinical Social Worker

## 2023-01-28 DIAGNOSIS — F1020 Alcohol dependence, uncomplicated: Secondary | ICD-10-CM

## 2023-01-28 DIAGNOSIS — F39 Unspecified mood [affective] disorder: Secondary | ICD-10-CM

## 2023-01-28 NOTE — Progress Notes (Signed)
Daily Group Progress Note   Program: CD IOP     Group Time: 9 a.m. to 12 p.m.    Type of Therapy: Process and Psychoeducational    Topic: The therapists check in with group members, assess for SI/HI/psychosis and overall level of functioning. The therapists inquire about sobriety date and number of community support meetings attended since last session.    The therapists focus today primarily on the necessity of learning to reach out to sober sobers in the program while noting that this is not a behavior that people in early recovery are comfortable in doing. The therapists emphasize the importance of honesty in recovery and in avoiding all people who use substances in addition to toxic relationships in which substance use is not involved as these types of relationships lead to emotional relapse which in turn will lead to behavioral relapse. The therapists validate that recovery is not a perfect thing but messy with real progress being two steps forward and one step back. Additionally, it is pointed out that times in which people feel unworthy or too ashamed to return to meetings is exactly when they need to go to meetings and that if they "keep coming back" that things will finally turn a corner. Lastly, the therapists observe that avoiding discomfort is at the core of addiction while learning to sit with uncomfortable emotions is a the core of recovery.      Summary: [name] presents rating his depression as a "1" and his anxiety as a "1."    He says that he attended a meeting last night and plans on attending one at a different location tonight. He describes his mood as "calm" and "relaxed" adding that he is "down to earth" and "grounded" getting along well with his wife and family.  As is customary, Bill's check-in is very brief and as is also customary for him, the does not interject anything into any of the group discussion. This therapist asks [name] if he is free next week to which he answers in  the affirmative so requests to schedule an individual meeting with him to find out more concerning what is going on with him and to figure out a time to have a meeting with [name] and his wife as [name] suggested that his wife does not know if she has to work or will go home early until the day of work.   Thus, this therapist wants to figure out a way to work around this so as to have a conjoint session. [name] appears almost dismayed in scheduling this one-on-one meeting. The therapist may additionally need to have Bill complete the [employer] during this session.     Progress Towards Goals: [name] reports no alcohol use.    UDS collected: No  Results: No   AA/NA attended?: Yes   Sponsor?: No     Myrna Blazer, MA, LCSW, LCMHC, LCAS [name], MS, LMFT, LCAS 01/28/2023

## 2023-01-30 ENCOUNTER — Ambulatory Visit (INDEPENDENT_AMBULATORY_CARE_PROVIDER_SITE_OTHER): Payer: 59

## 2023-01-30 DIAGNOSIS — F39 Unspecified mood [affective] disorder: Secondary | ICD-10-CM

## 2023-01-30 DIAGNOSIS — F1020 Alcohol dependence, uncomplicated: Secondary | ICD-10-CM | POA: Diagnosis not present

## 2023-01-30 NOTE — Progress Notes (Signed)
   Daily Group Progress Note   Program: CD IOP     Group Time: 9 a.m. to 12 p.m.    Type of Therapy: Process and Psychoeducational    Topic: The therapist checks in with group members, assesses for SI/HI/psychosis and overall level of functioning. The therapist inquires about sobriety date and number of community support meetings attended since last session.    Therapists introduce the new group member. Therapists resume showing the video "Breaking the  Cycle" by Shanna Cisco, RN.  The cycle including the following elements beginning with "Belief System, Impaired Thinking and Unmanageable".  The next assignment was for group members to identify their favorite impaired thought that they told themselves before they admitted they had an addiction. Therapists emphasized the concept of changing the belief changes the cycle.  Therapists discussed the importance of getting group members to understand that addiction is brain based, chronic and progressive disease.    Summary: Anthony [name] identifies his depression as a "1" and his anxiety as a "1".  He says he has attending a meeting since last group.  Anthony [name] shares he has the same sobriety date. Anthony [name] identifies his emotions as "concerned and perplexed".  Anthony [name] shares he has a concern for his sons because of his alcoholism.  After therapist discusses the genetic bases of addiction and it being a brain disease, Anthony [name] shares that he thought he only became an alcoholic when he reached 37, however when he looks back he now realizes it began much earlier.  [name] reports he wonders what he can do for his sons. Therapist points out that the best thing he can do for his sons is to model recovery.  Anthony [name] shares he is is concerned about what he will do when his second son moves out, as Anthony [name] is now retired. Therapist discusses the importance of making a plan as to what he will do with his time to avoid boredom which often leads to relapse.   Therapist discusses how he needs to find a  new purpose since he is retired.   Anthony [name] shares his impaired thought that allowed him to think he did not have an addiction and that was that he was able to hold a job. Anthony [name] says he is still thinking about getting a sponsor.   Progress Towards Goals: Anthony [name] denies any use of alcohol or drugs   UDS collected: No Results: Negative   AA/NA attended?: Yes   Sponsor: No   [name], MS LMFT, [address] [name], [facility], LCAS 01-30-23

## 2023-02-02 ENCOUNTER — Ambulatory Visit (INDEPENDENT_AMBULATORY_CARE_PROVIDER_SITE_OTHER): Payer: 59

## 2023-02-02 DIAGNOSIS — F1020 Alcohol dependence, uncomplicated: Secondary | ICD-10-CM | POA: Diagnosis not present

## 2023-02-02 DIAGNOSIS — F39 Unspecified mood [affective] disorder: Secondary | ICD-10-CM

## 2023-02-02 NOTE — Progress Notes (Signed)
   Daily Group Progress Note   Program: CD IOP     Group Time: 9 a.m. to 12 p.m.    Type of Therapy: Process and Psychoeducational    Topic: The therapist checks in with group members, assesses for SI/HI/psychosis and overall level of functioning. The therapist inquires about sobriety date and number of community support meetings attended since last session.    Therapists discuss the importance of engaging in community in early recovery, as recovery does occur in community.  Therapists elaborate on how a sponsor  and twelve step meetings can be of support.  Therapists explore what fears and objection  group members have with getting a sponsor.  Therapist discuss the issues of whether an person in active addiction can be mature, as one is not at the point to allow themselves to sit with uncomfortable feelings without wanting to medicate them away.      Summary: Anthony [name] rates his depression as a "1" and his anxiety as a "1".  Anthony [name] says he did not attend any meetings over the weekend. He reports his sobriety date is the same. Anthony [name] identifies his emotions today as "relaxed and cautious".  Anthony [name] said they lost a family pet over the weekend and noted his wife was quite close to the pet. Anthony [name] says the wife is beating herself up for the death, thinking she could have made the vet do something more.Anthony [name] says his wife was redirecting those feelings to him. Anthony [name] says he plans to attend meetings this week and hopes to pick up some numbers.  Anthony [name] shared his hesitancy is getting a sponsor is that he is afraid a sponsor will make him deal with past memories, along with giving him homework.   Progress Towards Goals: Anthony [name] denies any use of alcohol or drugs   UDS collected: No Results:    AA/NA attended?: Yes   Sponsor: No   [name], MS LMFT, [address] [name], [facility], LCAS 02-02-23

## 2023-02-03 ENCOUNTER — Ambulatory Visit (INDEPENDENT_AMBULATORY_CARE_PROVIDER_SITE_OTHER): Payer: 59 | Admitting: Licensed Clinical Social Worker

## 2023-02-03 DIAGNOSIS — F1020 Alcohol dependence, uncomplicated: Secondary | ICD-10-CM

## 2023-02-03 DIAGNOSIS — F39 Unspecified mood [affective] disorder: Secondary | ICD-10-CM

## 2023-02-04 ENCOUNTER — Ambulatory Visit (INDEPENDENT_AMBULATORY_CARE_PROVIDER_SITE_OTHER): Payer: 59

## 2023-02-04 DIAGNOSIS — F418 Other specified anxiety disorders: Secondary | ICD-10-CM

## 2023-02-04 DIAGNOSIS — Z811 Family history of alcohol abuse and dependence: Secondary | ICD-10-CM

## 2023-02-04 DIAGNOSIS — Z566 Other physical and mental strain related to work: Secondary | ICD-10-CM

## 2023-02-04 DIAGNOSIS — F39 Unspecified mood [affective] disorder: Secondary | ICD-10-CM

## 2023-02-04 DIAGNOSIS — F1020 Alcohol dependence, uncomplicated: Secondary | ICD-10-CM

## 2023-02-04 DIAGNOSIS — F341 Dysthymic disorder: Secondary | ICD-10-CM

## 2023-02-04 DIAGNOSIS — Z6372 Alcoholism and drug addiction in family: Secondary | ICD-10-CM

## 2023-02-04 NOTE — Progress Notes (Addendum)
   Daily Group Progress Note   Program: CD IOP     Group Time: 9 a.m. to 12 p.m.    Type of Therapy: Process and Psychoeducational    Topic: The therapist checks in with group members, assesses for SI/HI/psychosis and overall level of functioning. The therapist inquires about sobriety date and number of community support meetings attended since last session.    Therapists discuss the importance of scheduling and having structure in one life, providing examples such as sleep schedules and meal schedules. Therapists emphasize the importance of scheduling personal life around recovery, rather than recovery around personal life, as recovery must take priority.  Therapists discuss how in early recovery, many people look to external controls as a means of maintaining sobriety, however if one does not switch to internal controls then long term sobriety is not likely to occur.  Therapists emphasize one's struggle is against the disease of addiction, and not against the family, treatment providers, etc.     Summary: Anthony [name] rates his depression and anxiety as a "1" today. He reports the same sobriety date. Anthony [name] says he has attended meetings since last group.  He says he plans to go to another meeting with the purpose of identifying a possible sponsor. He shared that he had a tooth extraction yesterday which was painful. He says previously when he was drinking he would drink 5-6 beers before going to the dentist and the dental assistant would comment about how much he bleed. Anthony [name] shares how he is learning to deal with having uncomfortable feelings now that he is not drinking and notes how this is a different experience for him.  Progress Towards Goals: Anthony [name] denies any use of alcohol or drugs   UDS collected: No Results: no    AA/NA attended?: Yes   Sponsor: No   [name], MS LMFT, LCAS [place], [facility], LCAS 02-04-23

## 2023-02-04 NOTE — Progress Notes (Signed)
Daily Progress Note   Program: CD IOP  Session Time: 3:05 p.m. to 4:05 p.m.   Type of Therapy: Individual   Therapist Response/Interventions: CBT/The therapist focuses on Anthony [name]'s social anxiety normalizing this in people in early recovery. He explains that meeting attendance is exposure therapy which will eventually help him to overcome this and suggests starting with getting a Sponsor.   Treatment Goals addressed:  Active     Substance Use     Anthony [name] will abstain from drinking alcohol 90 out of 90 days as evidenced by weekly UDS and Anthony [name] will attend AA meetings no less than 3 meetings per week and will obtain a sponsor per self report (Initial)     Start:  01/12/23    Expected End:  04/14/23         Anthony [name] will report a reduction in his depression and anxiety as evidenced by his PHQ-9 and GAD-7 both being a 4 or less. (Initial)     Start:  01/12/23    Expected End:  04/14/23         Therapist will assist Anthony [name] in identifying and avoiding triggers to drink while assisting him in overcoming barriers to obtaining a sober support system.     Start:  01/12/23         Therapist will assist Anthony [name] in identifying and changing thoughts and behaviors that contribute to his depression and anxiety.     Start:  01/12/23      Anthony [name] gives this therapist permission to electronically sign this plan on his behalf         Summary: Anthony [name] presents for his first initial session. The major focus of today's session is Anthony [name]'s social anxiety in relation to his social anxiety. Anthony [name] now understands that his alcoholism is a disease. He is attending meetings and reports that his mood is better.   Progress Towards Goals: Progressing  Suicidal/Homicidal: No SI or HI  Plan:  will attend [facility] IOP tomorrow.  Diagnosis:   Collaboration of Care: Other N/A  Patient/Guardian was advised Release of Information must be obtained prior to any record release in order to collaborate their care with an outside provider.  Patient/Guardian was advised if they have not already done so to contact the registration department to sign all necessary forms in order for [place] to release information regarding their care.   Consent: Patient/Guardian gives verbal consent for treatment and assignment of benefits for services provided during this visit. Patient/Guardian expressed understanding and agreed to proceed.   Myrna Blazer, MA, LCSW, [facility], LCAS 02/03/23

## 2023-02-05 ENCOUNTER — Encounter (HOSPITAL_COMMUNITY): Payer: Self-pay

## 2023-02-05 NOTE — Progress Notes (Addendum)
   [facility] Follow-up Outpatient CDIOP Date: 02/04/2023  Admission Date:01/14/2023  Sobriety date:12/31/22  Subjective: "I'm ok"  HPI :CD IOP Provider FU `Pt is seen for FU having recently started medication for craving (MAT Baclofen) and Chronic Anxious depression(WellbutrinSR) He is not taking the Baclofen partly because a group member reported significant fatigue and partly because he is at home and not triggered at present In speaking with him today concerning his anxiety, he says much of it stems from his job. 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 When queried about another job he says he is 5 years away from retiremnt after investing 35 years. (On CCA pt denied any problems with employment)  Counselor's report:  Anthony [name] rates his depression and anxiety as a "1" today. He reports the same sobriety date. Anthony [name] says he has attended meetings since last group.  He says he plans to go to another meeting with the purpose of identifying a possible sponsor. He shared that he had a tooth extraction yesterday which was painful. He says previously when he was drinking he would drink 5-6 beers before going to the dentist and the dental assistant would comment about how much he bleed. Anthony [name] shares how he is learning to deal with having uncomfortable feelings now that he is not drinking and notes how this is a different experience for him.   Review of Systems: Psychiatric: Agitation: See Counselor's report Hallucination: No Depressed Mood: See Counselor's report Insomnia: No complaint Hypersomnia: No Altered Concentration: No Feels Worthless: Rx meds for AUD Severe Dependence and PTSD with chronic Dysthymia/Anxious Depression Grandiose Ideas: No Belief In Special  Powers: No New/Increased Substance Abuse: No Compulsions: Not at home  Neurologic: Headache: No Seizure: No Paresthesias: No  Current Medications: baclofen 10 MG tablet Commonly known as: LIORESAL Take 1 tablet (10 mg total) by mouth 3 (three) times daily.  buPROPion ER 100 MG 12 hr tablet Commonly known as: Wellbutrin SR Take 1 tablet (100 mg total) by mouth 2 (two) times daily.  pantoprazole 40 MG tablet Commonly known as: PROTONIX Take 1 tablet (40 mg total) by mouth daily before breakfast.    Mental Status Examination  Appearance:Casual neat Alert: Yes Attention: good  Cooperative: Yes Eye Contact: Good Speech: Clear and coherent/Rate WNL Psychomotor Activity: Normal Memory:Broken family/Patrenal alcoholism/Job stress Concentration/Attention: Normal/intact Oriented: person, place, time/date and situation Mood: Tense/anxious/angry Affect: Appropriate and Congruent Thought Processes and Associations: Coherent and Intact Fund of Knowledge: Good Thought Content: WDL Insight: Counselor note on 7/29 Daily report sheet states patient seems to have gained insight into alcoholism as a disease Judgement: Impaired-not tested outside current treatment enviornment  HYQ:MVHQI 7/15, 01/26/2023  PDMP:000  Diagnosis:  Alcohol use disorder, severe, dependence (HCC) Unspecified mood (affective) disorder (HCC) Dysfunctional family due to alcoholism Family history of alcoholism in father Early onset dysthymia Anxious depression Stressful job  Assessment:Counselor note on Daily report sheet states patient seems to have gained insight into alcoholism as a disease He continues to abstain New revelation regarding job stress  Treatment Plan:Per Admission /Counselors/Provider FUs [name], PA-CPatient ID: Anthony [name], male   DOB: 1965/08/02, 57 y.o.   MRN: [id number]

## 2023-02-05 NOTE — Addendum Note (Signed)
Addended by: Court Joy on: 02/05/2023 12:42 PM   Modules accepted: Level of Service

## 2023-02-05 NOTE — Progress Notes (Signed)
   [facility] Follow-up Outpatient CDIOP Date: 01/21/2023  Admission Date:01/14/2023  Sobriety date:12/29/22  Subjective: "What I have been doing isnt working"  HPI:CD IOP Provider FU This is pt's first FU. He continues to have significant anxieties and cravings. When seen initially he was not interested in medication but now is willing to try.  Counselor's report: Anthony [name] rates his depression as a "2" and his anxiety as a "1". He identifies his emotions today as "excited and fearful".  Anthony [name] reports his sobriety date is the same. Anthony [name] shares he attended his first meeting in "quite a while".  He says there were reminders of reasons not to drink.   Review of Systems: Psychiatric: Agitation: See Counselor's report Hallucination: No Depressed Mood: See Counselors report Insomnia: PHQ9 01/12/23  Trouble falling or staying asleep, or sleeping too much More than half the days  Hypersomnia: No Altered Concentration: No Feels Worthless: PHQ 9 01/12/23 Feeling bad about yourself - or that you are a failure or have let yourself or your family down Several days  Grandiose Ideas: No Belief In Special Powers: No New/Increased Substance Abuse: No Compulsions: Cravings now   Neurologic: Headache: No Seizure: No Paresthesias: No  Current Medications: New Rx for Baclofen MAT  and Wellbutrin SR for Chronic anxious depression   Mental Status Examination  Appearance:Casual;Neat Tense Alert: Yes Attention: good  Cooperative: Yes Eye Contact: Good Speech: Clear and coherent Normal rate Psychomotor Activity: Normal but he is tense which is his normal state Memory:Comes from broken family-parents divorced when he was 14.Father and his brothers with Alcohol problems Concentration/Attention: Normal/intact Oriented: person, place, time/date and situation Mood: Euthymic Affect: Appropriate and Congruent Thought Processes and Associations: Coherent and Intact Fund of Knowledge:  Good Thought Content: WDL Insight: Lacking-resistant admitting his alcoholism Judgement: Impaired  EPP:IRJJO  PDMP:000  Diagnosis:  Alcohol use disorder, severe, dependence (HCC) Dysfunctional family due to alcoholism Family history of alcoholism in father Early onset dysthymia Anxious depression  Assessment:Accepting more therapy  Treatment Plan:Per Counselors and staring MAT Baclofen for cravings and Wellbutrin SR for Anxious depression Anthony Morn, PA-CPatient ID: Anthony [name], male   DOB: [birth year], 57 y.o.   MRN: [id number]

## 2023-02-05 NOTE — Addendum Note (Signed)
Addended by: Court Joy on: 02/05/2023 11:55 AM   Modules accepted: Level of Service

## 2023-02-06 ENCOUNTER — Ambulatory Visit (HOSPITAL_COMMUNITY): Payer: Self-pay

## 2023-02-06 ENCOUNTER — Ambulatory Visit (INDEPENDENT_AMBULATORY_CARE_PROVIDER_SITE_OTHER): Payer: 59

## 2023-02-06 ENCOUNTER — Encounter (HOSPITAL_COMMUNITY): Payer: Self-pay

## 2023-02-06 DIAGNOSIS — F1020 Alcohol dependence, uncomplicated: Secondary | ICD-10-CM | POA: Diagnosis not present

## 2023-02-06 DIAGNOSIS — F39 Unspecified mood [affective] disorder: Secondary | ICD-10-CM

## 2023-02-06 NOTE — Progress Notes (Addendum)
   Daily Group Progress Note   Program: CD IOP     Group Time: 9 a.m. to 12 p.m.    Type of Therapy: Process and Psychoeducational    Topic: The therapist checks in with group members, assesses for SI/HI/psychosis and overall level of functioning. The therapist inquires about sobriety date and number of community support meetings attended since last session.    Therapists show video of Jaymes Graff, RN, Breaking the cycle of Addiction. Therapists discusses the element of un-manageability in the cycle.  Therapists had group members identify when they recognized their addiction was unmanageable. Therapist discuss the preoccupation as a part of the cycle of addiction and emphasize the importance is not with trying to keep thoughts from entering the mind about using, as they will come. Therapists discuss the point of intervention is not in trying to not have the using thoughts, rather focus on what one can do something about, that being interrupting the cycle of ritual.    Summary: Anthony [name] rated his depression as a "1" and his anxiety as a "1".  [name] says he has not been to meeting since last meeting in IOP. Anthony [name] responded to the un-manageability assignment. [name] says he realized his drinking was unmanageble when he was not able to have a quality relationship with his children from his first marriage due to his use.  [name] says he has one son out of the house and one teenage son at home and he wants to keep his life manageable to he can be there for his sons, as he was not formerly close to the children from his first marriage.  Anthony [name] shared that he learned "mindfulness" when he was in residential treatment as a way to help ride out the thoughts of preoccupation regarding using.  Anthony [name] shared his take away from the group was the he will put his son's picture in stash places. Progress Towards Goals: [name] denies any use of alcohol or drugs   UDS collected: No Results: no    AA/NA attended?: Yes   Sponsor:  No   [name], MS LMFT, LCAS [place], [facility], LCAS 02-06-23

## 2023-02-09 ENCOUNTER — Ambulatory Visit (INDEPENDENT_AMBULATORY_CARE_PROVIDER_SITE_OTHER): Payer: 59

## 2023-02-09 ENCOUNTER — Encounter (HOSPITAL_COMMUNITY): Payer: Self-pay

## 2023-02-09 DIAGNOSIS — F1020 Alcohol dependence, uncomplicated: Secondary | ICD-10-CM

## 2023-02-09 DIAGNOSIS — F39 Unspecified mood [affective] disorder: Secondary | ICD-10-CM

## 2023-02-09 NOTE — Progress Notes (Signed)
   Daily Group Progress Note   Program: CD IOP     Group Time: 9 a.m. to 12 p.m.    Type of Therapy: Process and Psychoeducational    Topic: The therapist checks in with group members, assesses for SI/HI/psychosis and overall level of functioning. The therapist inquires about sobriety date and number of community support meetings attended since last session.    The therapists facilitate discussions on how to cope with grief in a healthy manner while in recovery and on ways to reduce stress via practicing self-care by lightening one's schedule, exercise, meditation, turning off televisions and other noise so as to allow one's nervous system to calm down from overstimulation, etcetera. The therapists answer questions from group members concerning the purpose of GenSight testing. The therapists talk about testing for the MTHFR mutation for persons with depression.     Summary: Anthony [name] rates his depression as a "1" and his anxiety as a "1".   He reports the same sobriety date.  Anthony [name] says he attends meetings on Tuesdays and Thursday and there has not been a meeting since our last group.  Anthony [name] says he wants to work on getting a sponsor.  Anthony [name] shares that he and his son worked on a motorcycle this weekend.  Anthony [name] reports he and his wife went on a outing to [employer].  Therapist inquires if he was triggered and he says "no". 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  He shares he was never a wine drinker.  Anthony [name] says if it would have been a cold beer on a hot day, he might have been triggered. Anthony [name] shares that they also went to [facility] and saw a hammock between two trees and a guy in the hammock relaxing. Anthony [name] says this is his goal.  Progress Towards Goals: Anthony [name] denies any use of alcohol or drugs   UDS collected: Yes Results: no    AA/NA attended?: Yes   Sponsor: No   [name], MS LMFT, LCAS [place],  [facility], LCAS 02-08-23

## 2023-02-09 NOTE — Addendum Note (Signed)
Addended by: Court Joy on: 02/09/2023 06:15 PM   Modules accepted: Level of Service

## 2023-02-10 ENCOUNTER — Telehealth (HOSPITAL_COMMUNITY): Payer: Self-pay | Admitting: Licensed Clinical Social Worker

## 2023-02-10 NOTE — Telephone Encounter (Signed)
Therapist faxes requested information to [facility].  Myrna Blazer, MA, LCSW, [facility], LCAS 02/10/2023

## 2023-02-11 ENCOUNTER — Ambulatory Visit (INDEPENDENT_AMBULATORY_CARE_PROVIDER_SITE_OTHER): Payer: 59

## 2023-02-11 DIAGNOSIS — F1020 Alcohol dependence, uncomplicated: Secondary | ICD-10-CM

## 2023-02-11 DIAGNOSIS — F39 Unspecified mood [affective] disorder: Secondary | ICD-10-CM

## 2023-02-11 NOTE — Progress Notes (Signed)
   Daily Group Progress Note   Program: CD IOP     Group Time: 9 a.m. to 12 p.m.    Type of Therapy: Process and Psychoeducational    Topic: The therapist checks in with group members, assesses for SI/HI/psychosis and overall level of functioning. The therapist inquires about sobriety date and number of community support meetings attended since last session.    The therapists discuss  addiction as a medical disease, specifically how cravings are a symptom of a medical disease, rather than a character defect. Therapists discuss when addicts use alcohol and drugs to number themselves out, they maintain schedules that are not healthy and when they stop using they can have more healthy schedules. Therapists discuss that in order to enjoy early recovery while exploring activities to participate in or reconnecting to an activity they enjoyed prior to active addiction.    Summary: [name] rates his depression as a "1" and his anxiety as a "1".  Bill reports the same sobriety date. In response to therapists discussing the importance of learning new activities or returning to one's previous to using to ease boredom, [name] shares that was a part of his residential program.  [name] shares the important news that he raised his hand in a meeting last night and has a temporary sponsor.  He says he and the sponsor can talk after 6pm as his sponsor indicated this is the best time for him. [name] shares that his sponsor has had 12 years of sobriety. [name] shares his emotions as "relaxed, content, but cautious". [name] says he feels good but wants to be cautious because he realizes he has relapsed in the past.  Progress Towards Goals: [name] denies any use of alcohol or drugs   UDS collected: Yes Results: no    AA/NA attended?: Yes   Sponsor: No   [name], MS LMFT, LCAS Rita Ohara, [facility], LCAS 02-11-23

## 2023-02-13 ENCOUNTER — Ambulatory Visit (INDEPENDENT_AMBULATORY_CARE_PROVIDER_SITE_OTHER): Payer: 59

## 2023-02-13 DIAGNOSIS — F39 Unspecified mood [affective] disorder: Secondary | ICD-10-CM

## 2023-02-13 DIAGNOSIS — F1020 Alcohol dependence, uncomplicated: Secondary | ICD-10-CM | POA: Diagnosis not present

## 2023-02-13 NOTE — Progress Notes (Signed)
   Daily Group Progress Note   Program: CD IOP     Group Time: 9 a.m. to 12 p.m.    Type of Therapy: Process and Psychoeducational    Topic: The therapist checks in with group members, assesses for SI/HI/psychosis and overall level of functioning. The therapist inquires about sobriety date and number of community support meetings attended since last session.   Therapists observe that people relapse often claiming that they did so for no particular reason; however, therapists illustrate how this is not true per the fundamental principle of behavior therapy; "all behavior is purposeful." Therapists explain that people in active addiction often use substances to get rid of what they perceive to be "bad" emotions; however, therapists debunk the myth of bad emotions pointing out that that emotions are neither good or bad nor are they right or wrong but that emotions are like indicators or guide posts telling a person what to do. For example, anxiety tells people that they need to slow down or lighten their load while depression tells them that they are lacking enough pleasurable activities or positive social interactions.  Therapists illustrate how addiction is a disease that impacts the entire family system explaining family systems theory and why family therapy is a necessary part of addiction treatment. Therapists also discuss how one's psychiatric and/or substance use diagnosis can be used as a weapon by dysfunctional family members trying to silence them. Lastly, therapists discuss the importance of "coming out" as an alcoholic or addict and how this connects to taking the First Step of the Twelve Steps.   Summary: Anthony [name] rates his depression as a "1" and his anxiety as a "1".  He reports he attended a meeting. Anthony [name] says he has the same sobriety date. Anthony [name] says at he meeting he talked to his new temporary sponsor. Anthony [name] says his sponsor gave him the assignment of reading the first step and writing down 3  things that stand out or that he may have questions about.  Anthony [name] says his take away today is he how realizes the family dynamics in his relationship with his former wife. Anthony [name] says his wife was "crazy" but he realizes that he would not have continued in the relationship if he did have the low self-esteem he had at that time.  Progress Towards Goals: Anthony [name] denies any use of alcohol or drugs   UDS collected: Yes Results: negative   AA/NA attended?: Yes   Sponsor: Yes   [name], MS LMFT, LCAS Bill Garrot, LCSW, [facility], LCAS 02-13-23

## 2023-02-16 ENCOUNTER — Encounter (HOSPITAL_COMMUNITY): Payer: Self-pay

## 2023-02-16 ENCOUNTER — Ambulatory Visit (HOSPITAL_COMMUNITY): Payer: 59 | Admitting: Licensed Clinical Social Worker

## 2023-02-16 ENCOUNTER — Telehealth (HOSPITAL_COMMUNITY): Payer: Self-pay | Admitting: Licensed Clinical Social Worker

## 2023-02-16 ENCOUNTER — Encounter (HOSPITAL_COMMUNITY): Payer: Self-pay | Admitting: Licensed Clinical Social Worker

## 2023-02-16 NOTE — Telephone Encounter (Signed)
The therapist attempts to reach Anthony [name] by phone when he does not show up for group today; however, the number in [employer] is not in service.  Thus, the therapist sends a letter to him via My Chart.  Myrna Blazer, MA, LCSW, [facility], LCAS 02/16/2023

## 2023-02-18 ENCOUNTER — Ambulatory Visit (INDEPENDENT_AMBULATORY_CARE_PROVIDER_SITE_OTHER): Payer: 59

## 2023-02-18 ENCOUNTER — Other Ambulatory Visit (HOSPITAL_COMMUNITY): Payer: Self-pay | Admitting: Medical

## 2023-02-18 DIAGNOSIS — F39 Unspecified mood [affective] disorder: Secondary | ICD-10-CM | POA: Diagnosis not present

## 2023-02-18 DIAGNOSIS — F1020 Alcohol dependence, uncomplicated: Secondary | ICD-10-CM | POA: Diagnosis not present

## 2023-02-18 MED ORDER — RISPERIDONE 1 MG PO TABS: 1.0000 mg | ORAL_TABLET | Freq: Every day | ORAL | 0 refills | Status: DC

## 2023-02-18 NOTE — Progress Notes (Deleted)
   Daily Group Progress Note   Program: CD IOP     Group Time: 9 a.m. to 12 p.m.    Type of Therapy: Process and Psychoeducational    Topic: The therapist checks in with group members, assesses for SI/HI/psychosis and overall level of functioning. The therapist inquires about sobriety date and number of community support meetings attended since last session.    Progress Towards Goals: [name] reports he relapsed over the weekend.   UDS collected: no  Results: none   AA/NA attended?: no   Sponsor: Yes   [name], MS LMFT, [address] [name], [facility], LCAS 02-18-23
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
## 2023-02-18 NOTE — Addendum Note (Signed)
Addended by: Court Joy on: 02/18/2023 05:36 PM   Modules accepted: Orders

## 2023-02-18 NOTE — Progress Notes (Addendum)
Patient ID: Anthony [name], male   DOB: 06-18-1966, 57 y.o.   MRN: [id number] Informed by Counselor of patient trauma. Will send rx for Risperdal HS 1 mg and see pt ASAP. Left VM for patient. CHAT sent to [employer] and [facility]

## 2023-02-18 NOTE — Progress Notes (Signed)
Bill no showed for today's group.  Myrna Blazer, MA, LCSW, [facility], LCAS 02/16/2023

## 2023-02-20 ENCOUNTER — Ambulatory Visit (HOSPITAL_COMMUNITY)
Admission: EM | Admit: 2023-02-20 | Discharge: 2023-02-20 | Disposition: A | Discharge status: Other Acute Inpatient Hospital | Payer: 59

## 2023-02-20 ENCOUNTER — Telehealth (HOSPITAL_COMMUNITY): Payer: Self-pay | Admitting: Licensed Clinical Social Worker

## 2023-02-20 ENCOUNTER — Emergency Department (HOSPITAL_COMMUNITY)
Admission: EM | Admit: 2023-02-20 | Discharge: 2023-02-20 | Disposition: A | Discharge status: Home / Self Care | Payer: 59 | Attending: Emergency Medicine | Admitting: Emergency Medicine

## 2023-02-20 ENCOUNTER — Ambulatory Visit (INDEPENDENT_AMBULATORY_CARE_PROVIDER_SITE_OTHER): Payer: 59

## 2023-02-20 ENCOUNTER — Other Ambulatory Visit: Payer: Self-pay

## 2023-02-20 DIAGNOSIS — F1010 Alcohol abuse, uncomplicated: Secondary | ICD-10-CM

## 2023-02-20 DIAGNOSIS — Y908 Blood alcohol level of 240 mg/100 ml or more: Secondary | ICD-10-CM | POA: Insufficient documentation

## 2023-02-20 DIAGNOSIS — F1020 Alcohol dependence, uncomplicated: Secondary | ICD-10-CM

## 2023-02-20 DIAGNOSIS — F39 Unspecified mood [affective] disorder: Secondary | ICD-10-CM

## 2023-02-20 DIAGNOSIS — Z79899 Other long term (current) drug therapy: Secondary | ICD-10-CM | POA: Insufficient documentation

## 2023-02-20 DIAGNOSIS — F1090 Alcohol use, unspecified, uncomplicated: Secondary | ICD-10-CM | POA: Diagnosis present

## 2023-02-20 LAB — COMPREHENSIVE METABOLIC PANEL
ALT: 20 U/L (ref 0–44)
AST: 30 U/L (ref 15–41)
Albumin: 4.2 g/dL (ref 3.5–5.0)
Alkaline Phosphatase: 63 U/L (ref 38–126)
Anion gap: 15 (ref 5–15)
BUN: 5 mg/dL — ABNORMAL LOW (ref 6–20)
CO2: 20 mmol/L — ABNORMAL LOW (ref 22–32)
Calcium: 9.4 mg/dL (ref 8.9–10.3)
Chloride: 98 mmol/L (ref 98–111)
Creatinine, Ser: 0.88 mg/dL (ref 0.61–1.24)
GFR, Estimated: >60 mL/min (ref >60)
Glucose, Bld: 99 mg/dL (ref 70–99)
Potassium: 4 mmol/L (ref 3.5–5.1)
Sodium: 133 mmol/L — ABNORMAL LOW (ref 135–145)
Total Bilirubin: 0.4 mg/dL (ref 0.3–1.2)
Total Protein: 7.8 g/dL (ref 6.5–8.1)

## 2023-02-20 LAB — CBC WITH DIFFERENTIAL/PLATELET
Abs Immature Granulocytes: 0.02 10*3/uL (ref 0.00–0.07)
Basophils Absolute: 0.1 10*3/uL (ref 0.0–0.1)
Basophils Relative: 1 %
Eosinophils Absolute: 0 10*3/uL (ref 0.0–0.5)
Eosinophils Relative: 0 %
HCT: 44 % (ref 39.0–52.0)
Hemoglobin: 15.1 g/dL (ref 13.0–17.0)
Immature Granulocytes: 0 %
Lymphocytes Relative: 43 %
Lymphs Abs: 2.9 10*3/uL (ref 0.7–4.0)
MCH: 29.5 pg (ref 26.0–34.0)
MCHC: 34.3 g/dL (ref 30.0–36.0)
MCV: 86.1 fL (ref 80.0–100.0)
Monocytes Absolute: 0.3 10*3/uL (ref 0.1–1.0)
Monocytes Relative: 4 %
Neutro Abs: 3.4 10*3/uL (ref 1.7–7.7)
Neutrophils Relative %: 52 %
Platelets: 315 10*3/uL (ref 150–400)
RBC: 5.11 MIL/uL (ref 4.22–5.81)
RDW: 13.1 % (ref 11.5–15.5)
WBC: 6.7 10*3/uL (ref 4.0–10.5)
nRBC: 0 % (ref 0.0–0.2)

## 2023-02-20 LAB — RAPID URINE DRUG SCREEN, HOSP PERFORMED
Amphetamines: NOT DETECTED
Barbiturates: NOT DETECTED
Benzodiazepines: NOT DETECTED
Cocaine: NOT DETECTED
Opiates: NOT DETECTED
Tetrahydrocannabinol: NOT DETECTED

## 2023-02-20 LAB — ETHANOL: Alcohol, Ethyl (B): 304 mg/dL (ref <10)

## 2023-02-20 LAB — MAGNESIUM: Magnesium: 1.9 mg/dL (ref 1.7–2.4)

## 2023-02-20 LAB — LIPASE, BLOOD: Lipase: 70 U/L — ABNORMAL HIGH (ref 11–51)

## 2023-02-20 LAB — PHOSPHORUS: Phosphorus: 3.4 mg/dL (ref 2.5–4.6)

## 2023-02-20 MED ORDER — THIAMINE HCL 100 MG/ML IJ SOLN
100.0000 mg | Freq: Every day | INTRAMUSCULAR | Status: DC
  Filled 2023-02-20: qty 2

## 2023-02-20 MED ORDER — THIAMINE MONONITRATE 100 MG PO TABS
100.0000 mg | ORAL_TABLET | Freq: Every day | ORAL | Status: DC
  Administered 2023-02-20: 100 mg via ORAL
  Filled 2023-02-20: qty 1

## 2023-02-20 MED ORDER — ADULT MULTIVITAMIN W/MINERALS CH
1.0000 | ORAL_TABLET | Freq: Every day | ORAL | Status: DC
  Administered 2023-02-20: 1 via ORAL
  Filled 2023-02-20: qty 1

## 2023-02-20 MED ORDER — LORAZEPAM 2 MG/ML IJ SOLN: 1.0000 mg | INTRAMUSCULAR | Status: DC | PRN

## 2023-02-20 MED ORDER — LORAZEPAM 1 MG PO TABS: 1.0000 mg | ORAL_TABLET | ORAL | Status: DC | PRN

## 2023-02-20 MED ORDER — FOLIC ACID 1 MG PO TABS
1.0000 mg | ORAL_TABLET | Freq: Every day | ORAL | Status: DC
  Administered 2023-02-20: 1 mg via ORAL
  Filled 2023-02-20: qty 1

## 2023-02-20 NOTE — ED Provider Triage Note (Addendum)
Emergency Medicine Provider Triage Evaluation Note  Anthony [name] , a 57 y.o. male  was evaluated in triage.  Pt complains of requesting alcohol detox. Reports recently lost son and has been drinking a lot to cope. Denies SI. Denies other drug use. Denies N/V, abd pain. Sent from [facility] for medical clearance. Has had alcohol withdrawal before. Never had seizures.  Review of Systems  Positive: N/A Negative: N/V, abd pain  Physical Exam  BP (!) 153/97 (BP Location: Right Arm)   Pulse 90   Temp 98.3 F (36.8 C) (Oral)   Resp 16   Ht 5\' 9"  (1.753 m)   Wt 74.8 kg   SpO2 98%   BMI 24.37 kg/m  Gen:   Awake, no distress . Tearful on exam Resp:  Normal effort  MSK:   Moves extremities without difficulty, no tremors Other:  Abd soft, non-tender  Medical Decision Making  Medically screening exam initiated at 9:02 PM.  Appropriate orders placed.  CARLIN EZE was informed that the remainder of the evaluation will be completed by another provider, this initial triage assessment does not replace that evaluation, and the importance of remaining in the ED until their evaluation is complete.  Appropriate labs initiated.   CIWA protocol ordered. No signs of ETOH withdrawal at this time.    Rexford Maus, DO 02/20/23 2106

## 2023-02-20 NOTE — Telephone Encounter (Signed)
The therapist calls Anthony [name]' wife verifying her identity via two identifiers. During the initial call, Anthony [name] is not at home as he left apparently to go to the store to buy more alcohol. The therapist has a series of calls with Anthony [name] and his wife also verifying Anthony [name]'s identity via two identifiers.   Anthony [name] hangs up a couple of times and is on the phone with this therapist wandering away from the house apparently going to his vehicle to get more beer.   Anthony [name]'s wife says that she is tired of Anthony [name] doing this to her and is agreeable to his going to detox this weekend saying that she has to work and does not feel "safe" having him home drinking with their son to whom she refers to as "my" son.  The therapist advises Anthony [name] to get in the car and allow his wife to drive him to detox with Anthony [name] saying that he has a lot to think over and dragging the process out. The therapist informs Anthony [name] that this episode will end in detox and that it is best he go sooner rather than later. The therapist advises his wife to call 911 if Anthony [name] leaves in  his vehicle under the influence and if Anthony [name] continues to not get in her car to call 911 and have Law Enforcement know that he is impaired and has alcohol in his vehicle as it is likely that he will head out again and he was driving most likely impaired as he was reportedly drinking before he went to the store for more beer. He informs this therapist that he has had "six beers."  When the therapist advises Anthony [name]'s wife to call Law Enforcement, he tells the therapist, "don't let her feel good about this." The therapist advises his wife that if Anthony [name] is not in her car by 2:35 p.m. or in 5 minutes that she will likely need to call Law Enforcement to the scene to assist.   His wife takes down both the therapists' numbers such that she can attend conjoint sessions after Anthony [name] has been to detox as she alludes to longstanding marital problems and a lack of understanding of why he drinks and cannot stop  drinking. She says that this situation is traumatic for her as her first husband had Bipolar Disorder and that she had to have him involuntarily committed. She says that Anthony [name] will call her "stupid" which is upsetting as she has ADHD. She says that Anthony [name] will flip people off when they ride in the left lane. She says that he has not worked in months and that if his employer has him do something that he does not want to do that he responds by drinking. His wife says that when she has called Anthony [name]'s father that he has told her to wait until he passes out and to talk with Anthony [name] when he is sober with his wife questioning when he will be sober.  Anthony Blazer, MA, LCSW, [facility], LCAS 02/20/2023

## 2023-02-20 NOTE — ED Triage Notes (Signed)
Pt arrives to ED from [facility] with facility staff reporting that pt has tachycardia, nausea, and disorientation. Upon assessment,  Pt with HR of 87, pt a/o x 4, calm and cooperative, no nausea reported . Pt does endorse drinking 12 beers today.

## 2023-02-20 NOTE — ED Provider Notes (Cosign Needed Addendum)
Behavioral Health Urgent Care Medical Screening Exam  Patient Name: Anthony [name] MRN: [id number] Date of Evaluation: 02/20/23 Chief Complaint:   Diagnosis:  Final diagnoses:  None    History of Present illness: Anthony [name] is a 57 y.o. male. With long hx of Alcoholism brought in by his wife to [facility] intoxicated with report that he drank 12 cans of beers today.  He also reports that he drank about same yesterday.  On arrival patient went to the rest room and could not come out.  His wife brought him out from the rest room and assisted him to the chair.   Patient, when asked where he is reports that he is in [facility].  V/S are as follows-HR 115-117, HR 160/100.  He declined Temperature stating he is nauseous.  Patient denies alcohol withdrawal Seizures but he and his wife states he does have severe withdrawal like Delirium tremors.  Patient and wife were informed that patient need to go to [facility] for appropriate alcohol detox treatment.  DR Joneen Caraway accepted patient.  Flowsheet Row Counselor from 01/12/2023 in [facility]  C-SSRS RISK CATEGORY No Risk       Psychiatric Specialty Exam  Presentation  General Appearance:Casual  Eye Contact:Fleeting  Speech:Slow  Speech Volume:Decreased  Handedness:Right   Mood and Affect  Mood: Anxious  Affect: Congruent   Thought Process  Thought Processes: Coherent  Descriptions of Associations:Loose  Orientation:Partial  Thought Content:Logical  Diagnosis of Schizophrenia or Schizoaffective disorder in past: No   Hallucinations:None  Ideas of Reference:None  Suicidal Thoughts:No  Homicidal Thoughts:No   Sensorium  Memory: Immediate Fair; Recent Fair; Remote Fair  Judgment: Impaired  Insight: Lacking   Executive Functions  Concentration: Fair  Attention Span: Fair  Recall: [employer] of Knowledge: Fair  Language: Fair   Psychomotor Activity  Psychomotor  Activity: Normal   Assets  Assets: Desire for Improvement; Housing; Intimacy   Sleep  Sleep: Fair  Number of hours: No data recorded  Physical Exam: Physical Exam ROS There were no vitals taken for this visit. There is no height or weight on file to calculate BMI.  Musculoskeletal: Strength & Muscle Tone: within normal limits Gait & Station: unsteady, intoxicated Patient leans:  see above   [facility] MSE Discharge Disposition for Follow up and Recommendations: Based on my evaluation the patient appears to have an emergency medical condition for which I recommend the patient be transferred to the emergency department for further evaluation.  Patient is intoxicated and need appropriate care in ER setting for Alcohol detox treatment.   Earney Navy, NP-PMHNP-BC 02/20/2023, 6:32 PM

## 2023-02-20 NOTE — Progress Notes (Signed)
   02/20/23 1756  [facility] Triage Screening (Walk-ins at [facility] only)  How Did You Hear About [place]? Family/Friend  What Is the Reason for Your Visit/Call Today? Pt presents to [facility] voluntarily accompanied by his wife for alcohol detox. Pt is extremely intoxicated stumbling, mumbled speech, disoriented. Pt was asked where is located at this moment, and responded with "[name]".Pt was sitting on toilet with his pants down, unable to get up without assistance. Pt reports drinking 12 cans of beer prior to coming to this facility. Pt reports daily alcohol consumption, about 12 cans of beer daily. Pts wife states the pt has a long hx of treatment for alcoholism but has not been compliant with treatment.Pt denies SI/HI and AVH.  How Long Has This Been Causing You Problems? <Week  Have You Recently Had Any Thoughts About Hurting Yourself? No  Are You Planning to Commit Suicide/Harm Yourself At This time? No  Have you Recently Had Thoughts About Hurting Someone Karolee Ohs? No  Are You Planning To Harm Someone At This Time? No  Are you currently experiencing any auditory, visual or other hallucinations? No  Have You Used Any Alcohol or Drugs in the Past 24 Hours? Yes  How long ago did you use Drugs or Alcohol? today  What Did You Use and How Much? 12 cans of beer  Do you have any current medical co-morbidities that require immediate attention? Yes  Please describe current medical co-morbidities that require immediate attention: unstable gait, alcohol intoxication  What Do You Feel Would Help You the Most Today? Alcohol or Drug Use Treatment  If access to [facility] was not available, would you have sought care in the Emergency Department? No  Determination of Need Emergent (2 hours)  Options For Referral ED Visit

## 2023-02-20 NOTE — ED Notes (Signed)
EMS was called for second time to transport pt to [facility] and dispatcher stated no unit available at this time

## 2023-02-20 NOTE — ED Provider Notes (Signed)
[facility] Emergency Department Provider Note MRN:  [id number]  Arrival date & time: 02/20/23     Chief Complaint   Tachycardia   History of Present Illness   Anthony [name] is a 57 y.o. year-old male presents to the ED with chief complaint of alcohol problem.  He states that his drinking has been causing problems at home.  He went to [facility], but was sent to [facility] because his HR was fast and he was hypertensive.  Patient states that he doesn't feel like he is in withdrawal.  He denies SI/HI.  He states that he'd rather follow-up outpatient than wait for TTS consult in ED.  He denies any pain.  Denies any recent illness.  History provided by patient.   Review of Systems  Pertinent positive and negative review of systems noted in HPI.    Physical Exam   Vitals:   02/20/23 2052  BP: (!) 153/97  Pulse: 90  Resp: 16  Temp: 98.3 F (36.8 C)  SpO2: 98%    CONSTITUTIONAL:  non toxic-appearing, NAD NEURO:  Alert and oriented x 3, CN 3-12 grossly intact EYES:  eyes equal and reactive ENT/NECK:  Supple, no stridor  CARDIO:  normal rate, regular rhythm, appears well-perfused  PULM:  No respiratory distress, CTAB GI/GU:  non-distended,  MSK/SPINE:  No gross deformities, no edema, moves all extremities  SKIN:  no rash, atraumatic   *Additional and/or pertinent findings included in MDM below  Diagnostic and Interventional Summary    EKG Interpretation Date/Time:    Ventricular Rate:    PR Interval:    QRS Duration:    QT Interval:    QTC Calculation:   R Axis:      Text Interpretation:         Labs Reviewed  COMPREHENSIVE METABOLIC PANEL - Abnormal; Notable for the following components:      Result Value   Sodium 133 (*)    CO2 20 (*)    BUN 5 (*)    All other components within normal limits  ETHANOL - Abnormal; Notable for the following components:   Alcohol, Ethyl (B) 304 (*)    All other components within normal limits  LIPASE, BLOOD -  Abnormal; Notable for the following components:   Lipase 70 (*)    All other components within normal limits  RAPID URINE DRUG SCREEN, HOSP PERFORMED  MAGNESIUM  PHOSPHORUS  CBC WITH DIFFERENTIAL/PLATELET  CBC WITH DIFFERENTIAL/PLATELET  CBC WITH DIFFERENTIAL/PLATELET    No orders to display    Medications  LORazepam (ATIVAN) tablet 1-4 mg (has no administration in time range)    Or  LORazepam (ATIVAN) injection 1-4 mg (has no administration in time range)  thiamine (VITAMIN B1) tablet 100 mg (100 mg Oral Given 02/20/23 2143)    Or  thiamine (VITAMIN B1) injection 100 mg ( Intravenous See Alternative 02/20/23 2143)  folic acid (FOLVITE) tablet 1 mg (1 mg Oral Given 02/20/23 2143)  multivitamin with minerals tablet 1 tablet (1 tablet Oral Given 02/20/23 2143)     Procedures  /  Critical Care Procedures  ED Course and Medical Decision Making  I have reviewed the triage vital signs, the nursing notes, and pertinent available records from the EMR.  Social Determinants Affecting Complexity of Care: Patient has no clinically significant social determinants affecting this chief complaint..   ED Course:    Medical Decision Making Patient sent over from behavioral health urgent care for evaluation of alcohol withdrawal.  He exhibits no symptoms of alcohol withdrawal on my exam.  He is not tremulous.  He has clear coherent thoughts.  He ambulates with a steady smooth gait.  He states that he initially wanted to have assistance with alcohol use and was interested in detox.  He states that this is causing issues at home.  I offered him evaluation by TTS here, but patient states that at this point, he would rather go home and follow-up outpatient.  I do not believe him to be a risk to himself or others.  I think that outpatient follow-up is appropriate in his case.  Amount and/or Complexity of Data Reviewed Labs: ordered.    Details: ETOH 304, but patient seems clinically sober, ambulates  with steady gait, no slurred speech, thoughts are coherent  Mild hyponatremia, likely 2/2 ETOH use         Consultants: No consultations were needed in caring for this patient.   Treatment and Plan: I considered admission due to patient's initial presentation, but after considering the examination and diagnostic results, patient will not require admission and can be discharged with outpatient follow-up.    Final Clinical Impressions(s) / ED Diagnoses     ICD-10-CM   1. Alcohol abuse  F10.10       ED Discharge Orders     None         Discharge Instructions Discussed with and Provided to Patient:   Discharge Instructions   None      Roxy Horseman, PA-C 02/20/23 2258    Lonell Grandchild, MD 02/20/23 2321

## 2023-02-20 NOTE — Progress Notes (Signed)
   Daily Group Progress Note   Program: CD IOP     Group Time: 9 a.m. to 12 p.m.    Type of Therapy: Process and Psychoeducational    Topic: The therapist checks in with group members, assesses for SI/HI/psychosis and overall level of functioning. The therapist inquires about sobriety date and number of community support meetings attended since last session.   The therapists facilitate discussion on the following topics:  the biological of addiction, specifically how one may be addicted to a specific substance and not another; opioids for post surgical pain versus being a treatment for chronic pain; addiction masking itself as other things, such as health problems and criminality; trauma and how the brain interprets  pieces together pieces of events in an altered state of consciousness; reservations relative to using.  Summary: Anthony [name] rates his depression as a "6" and his anxiety as a "6".  Anthony [name] shares that he has had a very tough few days, as he is still feeling guilty about his son's accident. Therapist points out that he is having irrational guilt. Bill recalls the five miles down the mountain trip for his son. Anthony [name] says his son had an helmet on and did not sustain any serious injuries. Anthony [name] shares he has continued to drink as he can't handle the guilt he is having.   Progress Towards Goals: Anthony [name] has a new sobriety date (02-20-23)   UDS collected: Yes Results: none   AA/NA attended?: no   Sponsor: Yes   [name], MS LMFT, [address] [name], [facility], LCAS 02-20-23

## 2023-02-20 NOTE — ED Notes (Signed)
Talked with patient until EMS picked him up. Patient encouraged to go to [facility] to be evaluated. Patient agreeable to go at this time. Walked out to the EMS with no sxs of distress noted

## 2023-02-20 NOTE — ED Notes (Signed)
Ems  was called .charge nurse Hester was called

## 2023-02-23 ENCOUNTER — Telehealth (HOSPITAL_COMMUNITY): Payer: Self-pay | Admitting: Licensed Clinical Social Worker

## 2023-02-23 ENCOUNTER — Ambulatory Visit (HOSPITAL_COMMUNITY): Payer: 59

## 2023-02-23 ENCOUNTER — Encounter (HOSPITAL_COMMUNITY): Payer: Self-pay

## 2023-02-23 NOTE — Telephone Encounter (Signed)
The therapist attempts to reach Anthony [name] leaving a HIPAA-compliant voicemail with his direct callback number which is [phone].  Myrna Blazer, MA, LCSW, [facility], LCAS 02/23/2023

## 2023-02-24 ENCOUNTER — Other Ambulatory Visit: Payer: Self-pay

## 2023-02-24 ENCOUNTER — Ambulatory Visit (INDEPENDENT_AMBULATORY_CARE_PROVIDER_SITE_OTHER)
Admission: EM | Admit: 2023-02-24 | Discharge: 2023-02-25 | Disposition: A | Discharge status: Other Acute Inpatient Hospital | Payer: 59 | Source: Home / Self Care

## 2023-02-24 ENCOUNTER — Telehealth (HOSPITAL_COMMUNITY): Payer: Self-pay | Admitting: Licensed Clinical Social Worker

## 2023-02-24 DIAGNOSIS — F10231 Alcohol dependence with withdrawal delirium: Secondary | ICD-10-CM | POA: Insufficient documentation

## 2023-02-24 DIAGNOSIS — F10931 Alcohol use, unspecified with withdrawal delirium: Secondary | ICD-10-CM

## 2023-02-24 DIAGNOSIS — F1010 Alcohol abuse, uncomplicated: Secondary | ICD-10-CM

## 2023-02-24 DIAGNOSIS — F10229 Alcohol dependence with intoxication, unspecified: Secondary | ICD-10-CM | POA: Insufficient documentation

## 2023-02-24 DIAGNOSIS — Y907 Blood alcohol level of 200-239 mg/100 ml: Secondary | ICD-10-CM | POA: Insufficient documentation

## 2023-02-24 DIAGNOSIS — F10131 Alcohol abuse with withdrawal delirium: Secondary | ICD-10-CM

## 2023-02-24 DIAGNOSIS — F419 Anxiety disorder, unspecified: Secondary | ICD-10-CM | POA: Insufficient documentation

## 2023-02-24 LAB — ETHANOL: Alcohol, Ethyl (B): 232 mg/dL — ABNORMAL HIGH (ref <10)

## 2023-02-24 MED ORDER — ADULT MULTIVITAMIN W/MINERALS CH
1.0000 | ORAL_TABLET | Freq: Every day | ORAL | Status: DC
  Administered 2023-02-24: 1 via ORAL
  Filled 2023-02-24: qty 1

## 2023-02-24 MED ORDER — THIAMINE HCL 100 MG/ML IJ SOLN
100.0000 mg | Freq: Once | INTRAMUSCULAR | Status: AC
  Administered 2023-02-24: 100 mg via INTRAMUSCULAR
  Filled 2023-02-24: qty 2

## 2023-02-24 MED ORDER — LORAZEPAM 1 MG PO TABS: 1.0000 mg | ORAL_TABLET | Freq: Every day | ORAL | Status: DC

## 2023-02-24 MED ORDER — MAGNESIUM HYDROXIDE 400 MG/5ML PO SUSP: 30.0000 mL | Freq: Every day | ORAL | Status: DC | PRN

## 2023-02-24 MED ORDER — OLANZAPINE 10 MG PO TBDP
10.0000 mg | ORAL_TABLET | Freq: Three times a day (TID) | ORAL | Status: DC | PRN
  Administered 2023-02-25: 10 mg via ORAL
  Filled 2023-02-24: qty 1

## 2023-02-24 MED ORDER — LORAZEPAM 1 MG PO TABS
1.0000 mg | ORAL_TABLET | Freq: Four times a day (QID) | ORAL | Status: DC
  Administered 2023-02-24: 1 mg via ORAL
  Filled 2023-02-24: qty 1

## 2023-02-24 MED ORDER — ACETAMINOPHEN 325 MG PO TABS
650.0000 mg | ORAL_TABLET | Freq: Four times a day (QID) | ORAL | Status: DC | PRN
  Administered 2023-02-24: 650 mg via ORAL
  Filled 2023-02-24: qty 2

## 2023-02-24 MED ORDER — ALUM & MAG HYDROXIDE-SIMETH 200-200-20 MG/5ML PO SUSP
30.0000 mL | ORAL | Status: DC | PRN
  Administered 2023-02-24 – 2023-02-25 (×2): 30 mL via ORAL
  Filled 2023-02-24 (×2): qty 30

## 2023-02-24 MED ORDER — CLONIDINE HCL 0.1 MG PO TABS
0.1000 mg | ORAL_TABLET | Freq: Once | ORAL | Status: AC
  Administered 2023-02-24: 0.1 mg via ORAL
  Filled 2023-02-24: qty 1

## 2023-02-24 MED ORDER — LORAZEPAM 1 MG PO TABS
1.0000 mg | ORAL_TABLET | ORAL | Status: AC | PRN
  Administered 2023-02-24: 1 mg via ORAL
  Filled 2023-02-24: qty 1

## 2023-02-24 MED ORDER — LORAZEPAM 1 MG PO TABS
1.0000 mg | ORAL_TABLET | Freq: Four times a day (QID) | ORAL | Status: DC | PRN
  Administered 2023-02-24 – 2023-02-25 (×2): 1 mg via ORAL
  Filled 2023-02-24 (×2): qty 1

## 2023-02-24 MED ORDER — INSULIN ASPART 100 UNIT/ML IJ SOLN: 0.0000 [IU] | Freq: Three times a day (TID) | INTRAMUSCULAR | Status: DC

## 2023-02-24 MED ORDER — LORAZEPAM 1 MG PO TABS: 1.0000 mg | ORAL_TABLET | Freq: Two times a day (BID) | ORAL | Status: DC

## 2023-02-24 MED ORDER — LOPERAMIDE HCL 2 MG PO CAPS
2.0000 mg | ORAL_CAPSULE | ORAL | Status: DC | PRN
  Administered 2023-02-24: 2 mg via ORAL
  Filled 2023-02-24: qty 1

## 2023-02-24 MED ORDER — HYDROXYZINE HCL 25 MG PO TABS: 25.0000 mg | ORAL_TABLET | Freq: Four times a day (QID) | ORAL | Status: DC | PRN

## 2023-02-24 MED ORDER — LORAZEPAM 1 MG PO TABS: 1.0000 mg | ORAL_TABLET | Freq: Three times a day (TID) | ORAL | Status: DC

## 2023-02-24 MED ORDER — INSULIN ASPART 100 UNIT/ML IJ SOLN: 0.0000 [IU] | Freq: Every day | INTRAMUSCULAR | Status: DC

## 2023-02-24 MED ORDER — THIAMINE MONONITRATE 100 MG PO TABS: 100.0000 mg | ORAL_TABLET | Freq: Every day | ORAL | Status: DC

## 2023-02-24 MED ORDER — ONDANSETRON 4 MG PO TBDP: 4.0000 mg | ORAL_TABLET | Freq: Four times a day (QID) | ORAL | Status: DC | PRN

## 2023-02-24 MED ORDER — ZIPRASIDONE MESYLATE 20 MG IM SOLR: 20.0000 mg | INTRAMUSCULAR | Status: DC | PRN

## 2023-02-24 NOTE — ED Provider Notes (Signed)
[name] Continuous Assessment Admission H&P  Date: 02/25/23 Patient Name: Anthony [name] MRN: [id number] Chief Complaint: alcohol abuse  Diagnoses:  Final diagnoses:  Alcohol abuse  Anxious mood    HPI: Anthony [name], 57 y/o male with a history of alcohol abuse, anxiety presented to [facility] for alcohol abuse.  Per the patient is needs alcohol detox.  Per the patient he drinks, 15-18 beers daily,  for the past 20 years.  Review of patient records show patient is a frequent flyer in the ED for the same problem.  According to patient he is currently not seeing a psychiatrist per the patient he is doing therapy IOP.  Patient was just seen in the ED on the 16th and discharge.  Patient does have a history of noncompliance with treatment.  Face-to-face observation of patient, patient is alert and oriented x 4, speech is clear sometimes blocked.  Patient does appear to be anxious, but this seems to be patient baseline.  Patient appears to be intoxicated, per the patient he drinks 15-18 beers daily for the past 20 years.  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  Per the patient he lives at home with his wife.  Patient denies SI, HI, AVH or paranoia at this time.  Patient denies smoking, denies any other illicit drug use.  Denies access to guns.  Per the patient he is currently not taking any medications.  At this time patient does not seem to be influenced by external or internal stimuli.   Recommend inpatient observation with possibly admission to [name] when a bed becomes available.  Total Time spent with patient: 20 minutes  Musculoskeletal  Strength & Muscle Tone: within normal limits Gait & Station: normal Patient leans: N/A  Psychiatric Specialty Exam  Presentation General Appearance:  Casual  Eye Contact: Good  Speech: Normal Rate  Speech Volume: Normal  Handedness: Right   Mood and Affect  Mood: Anxious  Affect: Congruent   Thought Process  Thought  Processes: Coherent  Descriptions of Associations:Circumstantial  Orientation:Full (Time, Place and Person)  Thought Content:WDL  Diagnosis of Schizophrenia or Schizoaffective disorder in past: No   Hallucinations:Hallucinations: None  Ideas of Reference:None  Suicidal Thoughts:Suicidal Thoughts: No  Homicidal Thoughts:Homicidal Thoughts: No   Sensorium  Memory: Immediate Fair  Judgment: Poor  Insight: Lacking   Executive Functions  Concentration: Fair  Attention Span: Fair  Recall: Fair  Fund of Knowledge: Fair  Language: Fair   Psychomotor Activity  Psychomotor Activity: Psychomotor Activity: Normal   Assets  Assets: Desire for Improvement   Sleep  Sleep: Sleep: Fair Number of Hours of Sleep: 4   Nutritional Assessment (For OBS and FBC admissions only) Has the patient had a weight loss or gain of 10 pounds or more in the last 3 months?: No Has the patient had a decrease in food intake/or appetite?: No Does the patient have dental problems?: No Does the patient have eating habits or behaviors that may be indicators of an eating disorder including binging or inducing vomiting?: No Has the patient recently lost weight without trying?: 0 Has the patient been eating poorly because of a decreased appetite?: 0 Malnutrition Screening Tool Score: 0    Physical Exam HENT:     Head: Normocephalic.     Nose: Nose normal.  Eyes:     Pupils: Pupils are equal, round, and reactive to light.  Cardiovascular:     Rate and Rhythm: Normal rate.  Pulmonary:     Effort:  Pulmonary effort is normal.  Musculoskeletal:        General: Normal range of motion.     Cervical back: Normal range of motion.  Neurological:     General: No focal deficit present.     Mental Status: He is alert.  Psychiatric:        Mood and Affect: Mood normal.        Behavior: Behavior normal.        Thought Content: Thought content normal.        Judgment: Judgment  normal.    Review of Systems  Constitutional: Negative.   HENT: Negative.    Eyes: Negative.   Respiratory: Negative.    Cardiovascular: Negative.   Gastrointestinal: Negative.   Genitourinary: Negative.   Musculoskeletal: Negative.   Skin: Negative.   Neurological: Negative.   Psychiatric/Behavioral:  Positive for substance abuse. The patient is nervous/anxious.     Blood pressure (!) 147/95, pulse (!) 110, temperature 97.8 F (36.6 C), temperature source Oral, resp. rate 20, SpO2 97%. There is no height or weight on file to calculate BMI.  Past Psychiatric History: Alcohol abuse, anxiety.  Is the patient at risk to self? No  Has the patient been a risk to self in the past 6 months? No .    Has the patient been a risk to self within the distant past? No   Is the patient a risk to others? No   Has the patient been a risk to others in the past 6 months? No   Has the patient been a risk to others within the distant past? No   Past Medical History: see chart  Family History: unknown  Social History: alcohol abuse  Last Labs:  Admission on 02/24/2023  Component Date Value Ref Range Status   Alcohol, Ethyl (B) 02/24/2023 232 (H)  <10 mg/dL Final   Comment: (NOTE) Lowest detectable limit for serum alcohol is 10 mg/dL.  For medical purposes only. Performed at [facility], [address] [place], [place] [address]   Admission on 02/20/2023, Discharged on 02/20/2023  Component Date Value Ref Range Status   Sodium 02/20/2023 133 (L)  135 - 145 mmol/L Final   Potassium 02/20/2023 4.0  3.5 - 5.1 mmol/L Final   Chloride 02/20/2023 98  98 - 111 mmol/L Final   CO2 02/20/2023 20 (L)  22 - 32 mmol/L Final   Glucose, Bld 02/20/2023 99  70 - 99 mg/dL Final   Glucose reference range applies only to samples taken after fasting for at least 8 hours.   BUN 02/20/2023 5 (L)  6 - 20 mg/dL Final   Creatinine, Ser 02/20/2023 0.88  0.61 - 1.24 mg/dL Final   Calcium 36/64/4034 9.4   8.9 - 10.3 mg/dL Final   Total Protein 74/25/9563 7.8  6.5 - 8.1 g/dL Final   Albumin 87/56/4332 4.2  3.5 - 5.0 g/dL Final   AST 95/18/8416 30  15 - 41 U/L Final   ALT 02/20/2023 20  0 - 44 U/L Final   Alkaline Phosphatase 02/20/2023 63  38 - 126 U/L Final   Total Bilirubin 02/20/2023 0.4  0.3 - 1.2 mg/dL Final   GFR, Estimated 02/20/2023 >60  >60 mL/min Final   Comment: (NOTE) Calculated using the CKD-EPI Creatinine Equation (2021)    Anion gap 02/20/2023 15  5 - 15 Final   Performed at [facility], [address] [place], [place] [address]   Alcohol, Ethyl (B) 02/20/2023  304 (HH)  <10 mg/dL Final   Comment: CRITICAL RESULT CALLED TO, READ BACK BY AND VERIFIED WITH K. GREENE RN 02/20/23 @2200  BY J.WHITE (NOTE) Lowest detectable limit for serum alcohol is 10 mg/dL.  For medical purposes only. Performed at [name], [address], [place], [place] [address]    Opiates 02/20/2023 NONE DETECTED  NONE DETECTED Final   Cocaine 02/20/2023 NONE DETECTED  NONE DETECTED Final   Benzodiazepines 02/20/2023 NONE DETECTED  NONE DETECTED Final   Amphetamines 02/20/2023 NONE DETECTED  NONE DETECTED Final   Tetrahydrocannabinol 02/20/2023 NONE DETECTED  NONE DETECTED Final   Barbiturates 02/20/2023 NONE DETECTED  NONE DETECTED Final   Comment: (NOTE) DRUG SCREEN FOR MEDICAL PURPOSES ONLY.  IF CONFIRMATION IS NEEDED FOR ANY PURPOSE, NOTIFY LAB WITHIN 5 DAYS.  LOWEST DETECTABLE LIMITS FOR URINE DRUG SCREEN Drug Class                     Cutoff (ng/mL) Amphetamine and metabolites    1000 Barbiturate and metabolites    200 Benzodiazepine                 200 Opiates and metabolites        300 Cocaine and metabolites        300 THC                            50 Performed at [name], [address] [place], [place] [address]    Lipase 02/20/2023 70 (H)  11 - 51 U/L Final   Performed at [facility], [address] [place], [place] [address]   Magnesium  02/20/2023 1.9  1.7 - 2.4 mg/dL Final   Performed at [facility], [address], [place], [place] [address]   Phosphorus 02/20/2023 3.4  2.5 - 4.6 mg/dL Final   Performed at [name], [address] [place], [place] [address]   WBC 02/20/2023 6.7  4.0 - 10.5 K/uL Final   RBC 02/20/2023 5.11  4.22 - 5.81 MIL/uL Final   Hemoglobin 02/20/2023 15.1  13.0 - 17.0 g/dL Final   HCT 07/09/7251 44.0  39.0 - 52.0 % Final   MCV 02/20/2023 86.1  80.0 - 100.0 fL Final   MCH 02/20/2023 29.5  26.0 - 34.0 pg Final   MCHC 02/20/2023 34.3  30.0 - 36.0 g/dL Final   RDW 66/44/0347 13.1  11.5 - 15.5 % Final   Platelets 02/20/2023 315  150 - 400 K/uL Final   nRBC 02/20/2023 0.0  0.0 - 0.2 % Final   Neutrophils Relative % 02/20/2023 52  % Final   Neutro Abs 02/20/2023 3.4  1.7 - 7.7 K/uL Final   Lymphocytes Relative 02/20/2023 43  % Final   Lymphs Abs 02/20/2023 2.9  0.7 - 4.0 K/uL Final   Monocytes Relative 02/20/2023 4  % Final   Monocytes Absolute 02/20/2023 0.3  0.1 - 1.0 K/uL Final   Eosinophils Relative 02/20/2023 0  % Final   Eosinophils Absolute 02/20/2023 0.0  0.0 - 0.5 K/uL Final   Basophils Relative 02/20/2023 1  % Final   Basophils Absolute 02/20/2023 0.1  0.0 - 0.1 K/uL Final   Immature Granulocytes 02/20/2023 0  % Final   Abs Immature Granulocytes 02/20/2023 0.02  0.00 - 0.07 K/uL Final   Performed at [facility], [address] [place], [place] [address]  Allergies: Patient has no known allergies.  Medications:  Facility Ordered Medications  Medication   acetaminophen (TYLENOL) tablet 650 mg   alum & mag hydroxide-simeth (MAALOX/MYLANTA) 200-200-20 MG/5ML suspension 30 mL   magnesium hydroxide (MILK OF MAGNESIA) suspension 30 mL   [COMPLETED] thiamine (VITAMIN B1) injection 100 mg   thiamine (VITAMIN B1) tablet 100 mg   multivitamin with minerals tablet 1 tablet   LORazepam (ATIVAN) tablet 1 mg   hydrOXYzine (ATARAX) tablet 25 mg   loperamide (IMODIUM)  capsule 2-4 mg   ondansetron (ZOFRAN-ODT) disintegrating tablet 4 mg   LORazepam (ATIVAN) tablet 1 mg   Followed by   LORazepam (ATIVAN) tablet 1 mg   Followed by   [name] ON 02/26/2023] LORazepam (ATIVAN) tablet 1 mg   Followed by   [name] ON 02/28/2023] LORazepam (ATIVAN) tablet 1 mg   OLANZapine zydis (ZYPREXA) disintegrating tablet 10 mg   And   [COMPLETED] LORazepam (ATIVAN) tablet 1 mg   And   ziprasidone (GEODON) injection 20 mg   [COMPLETED] cloNIDine (CATAPRES) tablet 0.1 mg   haloperidol (HALDOL) tablet 5 mg   And   benztropine (COGENTIN) tablet 1 mg   PTA Medications  Medication Sig   pantoprazole (PROTONIX) 40 MG tablet Take 1 tablet (40 mg total) by mouth daily before breakfast.   baclofen (LIORESAL) 10 MG tablet Take 1 tablet (10 mg total) by mouth 3 (three) times daily.   buPROPion ER (WELLBUTRIN SR) 100 MG 12 hr tablet Take 1 tablet (100 mg total) by mouth 2 (two) times daily.   risperiDONE (RISPERDAL) 1 MG tablet Take 1 tablet (1 mg total) by mouth at bedtime.    Screenings    Flowsheet Row Most Recent Value  CIWA-Ar Total 10       Medical Decision Making  Inpatient observation  Meds ordered this encounter  Medications   DISCONTD: insulin aspart (novoLOG) injection 0-15 Units    Order Specific Question:   Correction coverage:    Answer:   Moderate (average weight, post-op)    Order Specific Question:   CBG < 70:    Answer:   Implement Hypoglycemia Standing Orders and refer to Hypoglycemia Standing Orders sidebar report    Order Specific Question:   CBG 70 - 120:    Answer:   0 units    Order Specific Question:   CBG 121 - 150:    Answer:   2 units    Order Specific Question:   CBG 151 - 200:    Answer:   3 units    Order Specific Question:   CBG 201 - 250:    Answer:   5 units    Order Specific Question:   CBG 251 - 300:    Answer:   8 units    Order Specific Question:   CBG 301 - 350:    Answer:   11 units    Order Specific Question:   CBG 351 -  400:    Answer:   15 units    Order Specific Question:   CBG > 400    Answer:   call MD and obtain STAT lab verification   DISCONTD: insulin aspart (novoLOG) injection 0-5 Units    Order Specific Question:   Correction coverage:    Answer:   HS scale    Order Specific Question:   CBG < 70:    Answer:   Implement Hypoglycemia Standing Orders and refer to Hypoglycemia Standing Orders sidebar report  Order Specific Question:   CBG 70 - 120:    Answer:   0 units    Order Specific Question:   CBG 121 - 150:    Answer:   0 units    Order Specific Question:   CBG 151 - 200:    Answer:   0 units    Order Specific Question:   CBG 201 - 250:    Answer:   2 units    Order Specific Question:   CBG 251 - 300:    Answer:   3 units    Order Specific Question:   CBG 301 - 350:    Answer:   4 units    Order Specific Question:   CBG 351 - 400:    Answer:   5 units    Order Specific Question:   CBG > 400    Answer:   call MD and obtain STAT lab verification   acetaminophen (TYLENOL) tablet 650 mg   alum & mag hydroxide-simeth (MAALOX/MYLANTA) 200-200-20 MG/5ML suspension 30 mL   magnesium hydroxide (MILK OF MAGNESIA) suspension 30 mL   thiamine (VITAMIN B1) injection 100 mg   thiamine (VITAMIN B1) tablet 100 mg   multivitamin with minerals tablet 1 tablet   LORazepam (ATIVAN) tablet 1 mg   hydrOXYzine (ATARAX) tablet 25 mg   loperamide (IMODIUM) capsule 2-4 mg   ondansetron (ZOFRAN-ODT) disintegrating tablet 4 mg   FOLLOWED BY Linked Order Group    LORazepam (ATIVAN) tablet 1 mg    LORazepam (ATIVAN) tablet 1 mg    LORazepam (ATIVAN) tablet 1 mg    LORazepam (ATIVAN) tablet 1 mg   AND Linked Order Group    OLANZapine zydis (ZYPREXA) disintegrating tablet 10 mg    LORazepam (ATIVAN) tablet 1 mg    ziprasidone (GEODON) injection 20 mg   cloNIDine (CATAPRES) tablet 0.1 mg   AND Linked Order Group    haloperidol (HALDOL) tablet 5 mg    benztropine (COGENTIN) tablet 1 mg      Lab Orders          Ethanol         POCT Urine Drug Screen - (I-Screen)      Recommendations  Based on my evaluation the patient does not appear to have an emergency medical condition.  Sindy Guadeloupe, NP 02/25/23  6:07 AM

## 2023-02-24 NOTE — ED Notes (Signed)
Pt laying in bed talking with unit [facility] he is calm and cooperative will continue to monitor for safety

## 2023-02-24 NOTE — Progress Notes (Addendum)
   02/24/23 1849  [facility] Triage Screening (Walk-ins at [facility] only)  How Did You Hear About [place]? Family/Friend  What Is the Reason for Your Visit/Call Today? Pt presents to [facility] voluntarily accompanied by his wife for alcohol detox. Pt is intoxicated unstable gait, sharp speech,grunting,dry heaving, unable to sit still. Pt reports nausea,vomiting, stomach pain.Pt was recently seen on 8/16 for a similar chief complaint and presentation. Pt reports drinking 12 cans of beer prior to coming to this facility. Pt reports daily alcohol consumption, about 12 cans of beer daily. Pt denies SI/HI and AVH.  How Long Has This Been Causing You Problems? > than 6 months  Have You Recently Had Any Thoughts About Hurting Yourself? No  Are You Planning to Commit Suicide/Harm Yourself At This time? No  Have you Recently Had Thoughts About Hurting Someone Karolee Ohs? No  Are You Planning To Harm Someone At This Time? No  Are you currently experiencing any auditory, visual or other hallucinations? No  Have You Used Any Alcohol or Drugs in the Past 24 Hours? Yes  How long ago did you use Drugs or Alcohol? 3 hours ago  What Did You Use and How Much? 12 cans of beer  Do you have any current medical co-morbidities that require immediate attention? Yes  Please describe current medical co-morbidities that require immediate attention: nausea, vomiting, stomach pain  Clinician description of patient physical appearance/behavior: Pt is intoxicated unstable gait, sharp speech,grunting,dry heaving, unable to sit still  What Do You Feel Would Help You the Most Today? Alcohol or Drug Use Treatment  If access to [facility] was not available, would you have sought care in the Emergency Department? No  Determination of Need Emergent (2 hours)  Options For Referral ED Visit

## 2023-02-24 NOTE — BH Assessment (Signed)
Comprehensive Clinical Assessment (CCA) Note  02/24/2023 Anthony [name] [id number]  Disposition: Sindy Guadeloupe, NP, recommends admission to [facility].  The patient demonstrates the following risk factors for suicide: Chronic risk factors for suicide include: {Chronic Risk Factors for WUJWJXB:14782956}. Acute risk factors for suicide include: {Acute Risk Factors for OZHYQMV:78469629}. Protective factors for this patient include: {Protective Factors for Suicide BMWU:13244010}. Considering these factors, the overall suicide risk at this point appears to be {Desc; low/moderate/high:110033}. Patient {ACTION; IS/IS UVO:53664403} appropriate for outpatient follow up.  Anthony [name] is a 57 year old male presenting as a voluntary walk-in    Patient is currently being seen by [facility] for substance abuse. Patient is seen weekly.   Patient resides at home with wife and 3 children. Patient has been married for 14 years. Patient is currently employed and reports job related stressors. Patient denied access to guns. Patient seeking alcohol detox.     Chief Complaint:  Chief Complaint  Patient presents with   Alcohol Intoxication   Alcohol Problem   Visit Diagnosis:  Alcohol Dependence   CCA Screening, Triage and Referral (STR)  Patient Reported Information How did you hear about [place]? Family/Friend  What Is the Reason for Your Visit/Call Today? Pt presents to [facility] voluntarily accompanied by his wife for alcohol detox. Pt is intoxicated unstable gait, sharp speech,grunting,dry heaving, unable to sit still. Pt reports nausea,vomiting, stomach pain.Pt was recently seen on 8/16 for a similar chief complaint and presentation. Pt reports drinking 12 cans of beer prior to coming to this facility. Pt reports daily alcohol consumption, about 12 cans of beer daily. Pt denies SI/HI and AVH.  How Long Has This Been Causing You Problems? > than 6 months  What Do You Feel Would  Help You the Most Today? Alcohol or Drug Use Treatment   Have You Recently Had Any Thoughts About Hurting Yourself? No  Are You Planning to Commit Suicide/Harm Yourself At This time? No   Flowsheet Row ED from 02/24/2023 in [facility] Most recent reading at 02/24/2023  7:00 PM ED from 02/20/2023 in [facility] Emergency Department at [facility] Most recent reading at 02/20/2023  8:53 PM ED from 02/20/2023 in [facility] Most recent reading at 02/20/2023  6:46 PM  C-SSRS RISK CATEGORY No Risk No Risk No Risk       Have you Recently Had Thoughts About Hurting Someone Karolee Ohs? No  Are You Planning to Harm Someone at This Time? No  Explanation: n/a   Have You Used Any Alcohol or Drugs in the Past 24 Hours? Yes  What Did You Use and How Much? 12 cans of beer   Do You Currently Have a Therapist/Psychiatrist? Yes  Name of Therapist/Psychiatrist: Name of Therapist/Psychiatrist: [facility]   Have You Been Recently Discharged From Any Office Practice or Programs? No  Explanation of Discharge From Practice/Program: n/a     CCA Screening Triage Referral Assessment Type of Contact: Face-to-Face  Telemedicine Service Delivery:   Is this Initial or Reassessment?   Date Telepsych consult ordered in [facility]:    Time Telepsych consult ordered in [facility]:    Location of Assessment: [facility]  Provider Location: [facility]   Collateral Involvement: none reported   Does Patient Have a Automotive engineer Guardian? No  Legal Guardian Contact Information: n/a  Copy of Legal Guardianship Form: -- (n/a)  Legal Guardian Notified of Arrival: -- (n/a)  Legal Guardian Notified of Pending Discharge: -- (n/a)  If Minor and Not Living with Parent(s), Who has Custody? n/a  Is CPS involved or ever been involved? Never  Is APS involved or ever been involved? Never   Patient  Determined To Be At Risk for Harm To Self or Others Based on Review of Patient Reported Information or Presenting Complaint? No  Method: No Plan  Availability of Means: No access or NA  Intent: Vague intent or NA  Notification Required: No need or identified person  Additional Information for Danger to Others Potential: -- (n/a)  Additional Comments for Danger to Others Potential: n/a  Are There Guns or Other Weapons in Your Home? No  Types of Guns/Weapons: n/a  Are These Weapons Safely Secured?                            -- (n/a)  Who Could Verify You Are Able To Have These Secured: n/a  Do You Have any Outstanding Charges, Pending Court Dates, Parole/Probation? none reported  Contacted To Inform of Risk of Harm To Self or Others: Other: Comment    Does Patient Present under Involuntary Commitment? No    [place] of Residence: [place]   Patient Currently Receiving the Following Services: Individual Therapy   Determination of Need: Emergent (2 hours)   Options For Referral: ED Visit; Facility-Based Crisis; Medication Management; Outpatient Therapy     CCA Biopsychosocial Patient Reported Schizophrenia/Schizoaffective Diagnosis in Past: No   Strengths: self-awareness   Mental Health Symptoms Depression:   Hopelessness; Fatigue   Duration of Depressive symptoms:  Duration of Depressive Symptoms: Less than two weeks   Mania:   None   Anxiety:    Worrying; Tension; Restlessness; Fatigue (See GAD-7)   Psychosis:   None   Duration of Psychotic symptoms:    Trauma:   N/A   Obsessions:   N/A   Compulsions:   N/A   Inattention:   N/A   Hyperactivity/Impulsivity:   N/A   Oppositional/Defiant Behaviors:   None   Emotional Irregularity:   N/A   Other Mood/Personality Symptoms:   n/a    Mental Status Exam Appearance and self-care  Stature:   Average (5'9")   Weight:   Average weight   Clothing:   Casual   Grooming:    Normal   Cosmetic use:   None   Posture/gait:   Tense   Motor activity:   Restless   Sensorium  Attention:   Normal   Concentration:   Normal   Orientation:   X5   Recall/memory:   Normal   Affect and Mood  Affect:   Appropriate   Mood:   Depressed; Anxious   Relating  Eye contact:   Normal   Facial expression:   Responsive; Tense   Attitude toward examiner:   Cooperative   Thought and Language  Speech flow:  Normal   Thought content:   Appropriate to Mood and Circumstances   Preoccupation:   None   Hallucinations:   None   Organization:   Coherent   [employer] of Knowledge:   Average   Intelligence:   Average   Abstraction:   Functional   Judgement:   Impaired   Reality Testing:   Adequate   Insight:   Good   Decision Making:   Normal   Social Functioning  Social Maturity:   Isolates   Social Judgement:   Naive  Stress  Stressors:   Other (Comment); Family conflict (marital conflict regarding patients alcoholism)   Coping Ability:   Overwhelmed   Skill Deficits:   None   Supports:   Family; Support needed (spouse and sister)     Religion: Religion/Spirituality Are You A Religious Person?:  (n/a) How Might This Affect Treatment?: n/a  Leisure/Recreation: Leisure / Recreation Do You Have Hobbies?: Yes Leisure and Hobbies: unable to assess  Exercise/Diet: Exercise/Diet Do You Exercise?: No What Type of Exercise Do You Do?:  (n/a) How Many Times a Week Do You Exercise?:  (n/a) Have You Gained or Lost A Significant Amount of Weight in the Past Six Months?: No Number of Pounds Lost?:  (n/a) Do You Follow a Special Diet?: No Do You Have Any Trouble Sleeping?: No Explanation of Sleeping Difficulties: n/a   CCA Employment/Education Employment/Work Situation: Employment / Work Situation Employment Situation: Employed Work Stressors: yes Patient's Job has Been Impacted by Current  Illness: Yes Describe how Patient's Job has Been Impacted: unable to assess Has Patient ever Been in the [employer]?: No  Education: Education Is Patient Currently Attending School?: No Last Grade Completed: 14 Did You Product manager?: Yes What Type of College Degree Do you Have?: unable to assess Did You Have An Individualized Education Program (IIEP): No Did You Have Any Difficulty At School?: No (GPA 3.4) Patient's Education Has Been Impacted by Current Illness: No   CCA Family/Childhood History Family and Relationship History: Family history Marital status: Married Number of Years Married: 14 What types of issues is patient dealing with in the relationship?: marital conflict regarding patients alcoholism Additional relationship information: n/a Does patient have children?: Yes How many children?: 3 How is patient's relationship with their children?: good  Childhood History:  Childhood History By whom was/is the patient raised?: Mother Did patient suffer any verbal/emotional/physical/sexual abuse as a child?: No Did patient suffer from severe childhood neglect?: No Has patient ever been sexually abused/assaulted/raped as an adolescent or adult?: No Was the patient ever a victim of a crime or a disaster?: No Witnessed domestic violence?: No Has patient been affected by domestic violence as an adult?: No       CCA Substance Use Alcohol/Drug Use: Alcohol / Drug Use Pain Medications: see MAR Prescriptions: see MAR Over the Counter: see MAR History of alcohol / drug use?: Yes Longest period of sobriety (when/how long): 9 months two years ago. went to a residential program for 30 days at [facility]. Did aftercare 12 step meetings. No sponsor Negative Consequences of Use: Financial, Armed forces operational officer, Personal relationships, Work / School (2 DWI's. Last one was 2007) Withdrawal Symptoms: Tremors, Agitation, Anorexia, Change in blood pressure, Irritability, Sweats, Tachycardia                          ASAM's:  Six Dimensions of Multidimensional Assessment  Dimension 1:  Acute Intoxication and/or Withdrawal Potential:   Dimension 1:  Description of individual's past and current experiences of substance use and withdrawal: Continued usage.  Dimension 2:  Biomedical Conditions and Complications:   Dimension 2:  Description of patient's biomedical conditions and  complications: No medical problems reported  Dimension 3:  Emotional, Behavioral, or Cognitive Conditions and Complications:  Dimension 3:  Description of emotional, behavioral, or cognitive conditions and complications: Depression  Dimension 4:  Readiness to Change:  Dimension 4:  Description of Readiness to Change criteria: Patient motivated to stop drinking alcohol.  Dimension 5:  Relapse, Continued  use, or Continued Problem Potential:  Dimension 5:  Relapse, continued use, or continued problem potential critiera description: Daily drinking x 20 years. Longest sobriety has been 9 months  Dimension 6:  Recovery/Living Environment:  Dimension 6:  Recovery/Iiving environment criteria description: No alcohol use in the family.  No triggers in his living environment  ASAM Severity Score: ASAM's Severity Rating Score: 6  ASAM Recommended Level of Treatment: ASAM Recommended Level of Treatment: Level II Intensive Outpatient Treatment   Substance use Disorder (SUD) Substance Use Disorder (SUD)  Checklist Symptoms of Substance Use: Evidence of withdrawal (Comment), Evidence of tolerance, Continued use despite having a persistent/recurrent physical/psychological problem caused/exacerbated by use, Continued use despite persistent or recurrent social, interpersonal problems, caused or exacerbated by use, Presence of craving or strong urge to use, Persistent desire or unsuccessful efforts to cut down or control use, Large amounts of time spent to obtain, use or recover from the substance(s), Recurrent use that results in a  failure to fulfill major role obligations (work, school, home), Repeated use in physically hazardous situations, Social, occupational, recreational activities given up or reduced due to use, Substance(s) often taken in larger amounts or over longer times than was intended  Recommendations for Services/Supports/Treatments: Recommendations for Services/Supports/Treatments Recommendations For Services/Supports/Treatments: CD-IOP Intensive Chemical Dependency Program  Discharge Disposition: Discharge Disposition Medical Exam completed: Yes Disposition of Patient: Admit  DSM5 Diagnoses: Patient Active Problem List   Diagnosis Date Noted   Alcoholism (HCC) 08/12/2019   Anxiety state 08/12/2019   Bulimia nervosa? 08/12/2019     Referrals to Alternative Service(s): Referred to Alternative Service(s):   Place:   Date:   Time:    Referred to Alternative Service(s):   Place:   Date:   Time:    Referred to Alternative Service(s):   Place:   Date:   Time:    Referred to Alternative Service(s):   Place:   Date:   Time:     Burnetta Sabin, [facility]

## 2023-02-24 NOTE — Telephone Encounter (Signed)
The therapist returns Bill's wife's call. She expresses her extreme anger at the [facility] ER for having released Bill from the hospital at 11:30 p.m. acutely intoxicated and with no plan for how he would get home as she had his keys and wallet. He tried calling her as she was asleep and was awaken by the call. He arrived home on Saturday morning telling her that he walked home from the hospital. His wife expresses her intention to file a complaint outside of [facility] stating her belief that [facility] does not take internal complaints seriously. As she is a Engineer, civil (consulting) in the [employer] system she is aware of EMTALA but is unaware of how to go about filing an EMTALA complaint so asks this therapist to send her a link to this information.  She says that he has continued to drink and that his sister has told her that she cannot keep pawing her son off on others; however, his wife is aware that she cannot leave her 65 year-old son in Bill's supervision if he is intoxicated and no other, sober adult is in the home. She says that he is trying to make her feel guilty and that he follows her around the house. She is apparently having lengthy conversations trying to persuade him to get treatment and that he will lose his job if he continues as he has been doing.  His wife says that she took the keys for his vehicle on Monday thinking that he would ride his motorcycle to attend IOP. The therapist advises her to stop having conversations with him as, given that he is likely impaired the majority of the time, nothing constructive will come from these conversations. She is to inform him that the only option is for him to return to detox and to call 911 if necessary and otherwise, she is to have no contact with him. If he drinks and drives, she is to contact [employer] and if he follows her around the house harassing her and not leaving her alone, then she is also to call Law Enforcement and see if she can have him arrested for stalking,  drunk-and-disorderly, etcetera.  The therapist informs her that if [name] were to present for IOP that he would be taken downstairs to the [facility] for detox as he cannot return to IOP without completing detox.  Myrna Blazer, MA, LCSW, [facility], LCAS 02/24/2023

## 2023-02-25 ENCOUNTER — Inpatient Hospital Stay (HOSPITAL_COMMUNITY): Payer: 59

## 2023-02-25 ENCOUNTER — Ambulatory Visit (HOSPITAL_COMMUNITY): Payer: 59

## 2023-02-25 ENCOUNTER — Encounter (HOSPITAL_COMMUNITY): Payer: Self-pay

## 2023-02-25 ENCOUNTER — Other Ambulatory Visit: Payer: Self-pay

## 2023-02-25 ENCOUNTER — Emergency Department (HOSPITAL_COMMUNITY): Payer: 59

## 2023-02-25 ENCOUNTER — Inpatient Hospital Stay (HOSPITAL_COMMUNITY)
Admission: EM | Admit: 2023-02-25 | Discharge: 2023-03-04 | DRG: 896 | Disposition: A | Discharge status: Other Acute Inpatient Hospital | Payer: 59 | Attending: Internal Medicine | Admitting: Internal Medicine

## 2023-02-25 ENCOUNTER — Telehealth (HOSPITAL_COMMUNITY): Payer: Self-pay | Admitting: Licensed Clinical Social Worker

## 2023-02-25 DIAGNOSIS — R45851 Suicidal ideations: Secondary | ICD-10-CM | POA: Diagnosis present

## 2023-02-25 DIAGNOSIS — Z91199 Patient's noncompliance with other medical treatment and regimen due to unspecified reason: Secondary | ICD-10-CM

## 2023-02-25 DIAGNOSIS — Z833 Family history of diabetes mellitus: Secondary | ICD-10-CM | POA: Diagnosis not present

## 2023-02-25 DIAGNOSIS — F32A Depression, unspecified: Secondary | ICD-10-CM | POA: Diagnosis present

## 2023-02-25 DIAGNOSIS — R4182 Altered mental status, unspecified: Secondary | ICD-10-CM | POA: Diagnosis not present

## 2023-02-25 DIAGNOSIS — K219 Gastro-esophageal reflux disease without esophagitis: Secondary | ICD-10-CM | POA: Diagnosis present

## 2023-02-25 DIAGNOSIS — G9341 Metabolic encephalopathy: Secondary | ICD-10-CM | POA: Diagnosis present

## 2023-02-25 DIAGNOSIS — Y658 Other specified misadventures during surgical and medical care: Secondary | ICD-10-CM | POA: Diagnosis not present

## 2023-02-25 DIAGNOSIS — F419 Anxiety disorder, unspecified: Secondary | ICD-10-CM | POA: Diagnosis present

## 2023-02-25 DIAGNOSIS — I10 Essential (primary) hypertension: Secondary | ICD-10-CM | POA: Diagnosis present

## 2023-02-25 DIAGNOSIS — Z818 Family history of other mental and behavioral disorders: Secondary | ICD-10-CM

## 2023-02-25 DIAGNOSIS — F332 Major depressive disorder, recurrent severe without psychotic features: Secondary | ICD-10-CM | POA: Diagnosis not present

## 2023-02-25 DIAGNOSIS — F10931 Alcohol use, unspecified with withdrawal delirium: Secondary | ICD-10-CM | POA: Diagnosis present

## 2023-02-25 DIAGNOSIS — E876 Hypokalemia: Secondary | ICD-10-CM | POA: Diagnosis not present

## 2023-02-25 DIAGNOSIS — Y907 Blood alcohol level of 200-239 mg/100 ml: Secondary | ICD-10-CM | POA: Diagnosis present

## 2023-02-25 DIAGNOSIS — R569 Unspecified convulsions: Secondary | ICD-10-CM

## 2023-02-25 DIAGNOSIS — F1994 Other psychoactive substance use, unspecified with psychoactive substance-induced mood disorder: Secondary | ICD-10-CM | POA: Diagnosis present

## 2023-02-25 DIAGNOSIS — R410 Disorientation, unspecified: Secondary | ICD-10-CM | POA: Diagnosis not present

## 2023-02-25 DIAGNOSIS — F1020 Alcohol dependence, uncomplicated: Secondary | ICD-10-CM | POA: Diagnosis present

## 2023-02-25 DIAGNOSIS — F10231 Alcohol dependence with withdrawal delirium: Secondary | ICD-10-CM | POA: Diagnosis present

## 2023-02-25 DIAGNOSIS — E871 Hypo-osmolality and hyponatremia: Secondary | ICD-10-CM | POA: Diagnosis present

## 2023-02-25 DIAGNOSIS — G9340 Encephalopathy, unspecified: Secondary | ICD-10-CM | POA: Diagnosis not present

## 2023-02-25 DIAGNOSIS — T839XXA Unspecified complication of genitourinary prosthetic device, implant and graft, initial encounter: Secondary | ICD-10-CM | POA: Diagnosis not present

## 2023-02-25 DIAGNOSIS — R319 Hematuria, unspecified: Secondary | ICD-10-CM | POA: Diagnosis not present

## 2023-02-25 DIAGNOSIS — F1010 Alcohol abuse, uncomplicated: Secondary | ICD-10-CM | POA: Diagnosis not present

## 2023-02-25 LAB — GLUCOSE, CAPILLARY
Glucose-Capillary: 105 mg/dL — ABNORMAL HIGH (ref 70–99)
Glucose-Capillary: 115 mg/dL — ABNORMAL HIGH (ref 70–99)
Glucose-Capillary: 122 mg/dL — ABNORMAL HIGH (ref 70–99)
Glucose-Capillary: 98 mg/dL (ref 70–99)

## 2023-02-25 LAB — URINALYSIS, ROUTINE W REFLEX MICROSCOPIC
Bilirubin Urine: NEGATIVE
Glucose, UA: NEGATIVE mg/dL
Ketones, ur: 20 mg/dL — AB
Leukocytes,Ua: NEGATIVE
Nitrite: NEGATIVE
Protein, ur: NEGATIVE mg/dL
RBC / HPF: >50 RBC/hpf (ref 0–5)
Specific Gravity, Urine: 1.011 (ref 1.005–1.030)
pH: 7 (ref 5.0–8.0)

## 2023-02-25 LAB — COMPREHENSIVE METABOLIC PANEL
ALT: 31 U/L (ref 0–44)
AST: 67 U/L — ABNORMAL HIGH (ref 15–41)
Albumin: 3.7 g/dL (ref 3.5–5.0)
Alkaline Phosphatase: 56 U/L (ref 38–126)
Anion gap: 15 (ref 5–15)
BUN: 6 mg/dL (ref 6–20)
CO2: 24 mmol/L (ref 22–32)
Calcium: 8.5 mg/dL — ABNORMAL LOW (ref 8.9–10.3)
Chloride: 87 mmol/L — ABNORMAL LOW (ref 98–111)
Creatinine, Ser: 0.83 mg/dL (ref 0.61–1.24)
GFR, Estimated: >60 mL/min (ref >60)
Glucose, Bld: 103 mg/dL — ABNORMAL HIGH (ref 70–99)
Potassium: 3.9 mmol/L (ref 3.5–5.1)
Sodium: 126 mmol/L — ABNORMAL LOW (ref 135–145)
Total Bilirubin: 2.3 mg/dL — ABNORMAL HIGH (ref 0.3–1.2)
Total Protein: 6.6 g/dL (ref 6.5–8.1)

## 2023-02-25 LAB — AMMONIA: Ammonia: 14 umol/L (ref 9–35)

## 2023-02-25 LAB — CBC WITH DIFFERENTIAL/PLATELET
Abs Immature Granulocytes: 0.02 10*3/uL (ref 0.00–0.07)
Basophils Absolute: 0 10*3/uL (ref 0.0–0.1)
Basophils Relative: 1 %
Eosinophils Absolute: 0.1 10*3/uL (ref 0.0–0.5)
Eosinophils Relative: 1 %
HCT: 36 % — ABNORMAL LOW (ref 39.0–52.0)
Hemoglobin: 12.5 g/dL — ABNORMAL LOW (ref 13.0–17.0)
Immature Granulocytes: 0 %
Lymphocytes Relative: 15 %
Lymphs Abs: 0.8 10*3/uL (ref 0.7–4.0)
MCH: 29.5 pg (ref 26.0–34.0)
MCHC: 34.7 g/dL (ref 30.0–36.0)
MCV: 84.9 fL (ref 80.0–100.0)
Monocytes Absolute: 0.5 10*3/uL (ref 0.1–1.0)
Monocytes Relative: 9 %
Neutro Abs: 4.1 10*3/uL (ref 1.7–7.7)
Neutrophils Relative %: 74 %
Platelets: 230 10*3/uL (ref 150–400)
RBC: 4.24 MIL/uL (ref 4.22–5.81)
RDW: 12.9 % (ref 11.5–15.5)
WBC: 5.6 10*3/uL (ref 4.0–10.5)
nRBC: 0 % (ref 0.0–0.2)

## 2023-02-25 LAB — RAPID URINE DRUG SCREEN, HOSP PERFORMED
Amphetamines: NOT DETECTED
Barbiturates: NOT DETECTED
Benzodiazepines: POSITIVE — AB
Cocaine: NOT DETECTED
Opiates: NOT DETECTED
Tetrahydrocannabinol: NOT DETECTED

## 2023-02-25 LAB — MRSA NEXT GEN BY PCR, NASAL: MRSA by PCR Next Gen: NOT DETECTED

## 2023-02-25 LAB — ETHANOL: Alcohol, Ethyl (B): <10 mg/dL (ref <10)

## 2023-02-25 LAB — HIV ANTIBODY (ROUTINE TESTING W REFLEX): HIV Screen 4th Generation wRfx: NONREACTIVE

## 2023-02-25 LAB — FOLATE: Folate: 26.8 ng/mL (ref >5.9)

## 2023-02-25 LAB — VITAMIN B12: Vitamin B-12: 269 pg/mL (ref 180–914)

## 2023-02-25 MED ORDER — LORAZEPAM 2 MG/ML IJ SOLN: 1.0000 mg | INTRAMUSCULAR | Status: DC | PRN

## 2023-02-25 MED ORDER — POLYETHYLENE GLYCOL 3350 17 G PO PACK: 17.0000 g | PACK | Freq: Every day | ORAL | Status: DC | PRN

## 2023-02-25 MED ORDER — DEXMEDETOMIDINE HCL IN NACL 400 MCG/100ML IV SOLN
0.0000 ug/kg/h | INTRAVENOUS | Status: DC
  Administered 2023-02-25: 1.8 ug/kg/h via INTRAVENOUS
  Administered 2023-02-26: 1 ug/kg/h via INTRAVENOUS
  Administered 2023-02-26: 1.2 ug/kg/h via INTRAVENOUS
  Administered 2023-02-26: 1 ug/kg/h via INTRAVENOUS
  Administered 2023-02-26: 1.2 ug/kg/h via INTRAVENOUS
  Administered 2023-02-26: 1.6 ug/kg/h via INTRAVENOUS
  Administered 2023-02-27 (×2): 1 ug/kg/h via INTRAVENOUS
  Filled 2023-02-25 (×8): qty 100

## 2023-02-25 MED ORDER — PHENOBARBITAL SODIUM 130 MG/ML IJ SOLN: 65.0000 mg | Freq: Every day | INTRAMUSCULAR | Status: DC

## 2023-02-25 MED ORDER — LACTATED RINGERS IV BOLUS
1000.0000 mL | Freq: Once | INTRAVENOUS | Status: AC
  Administered 2023-02-25: 1000 mL via INTRAVENOUS

## 2023-02-25 MED ORDER — FOLIC ACID 1 MG PO TABS: 1.0000 mg | ORAL_TABLET | Freq: Every day | ORAL | Status: DC

## 2023-02-25 MED ORDER — THIAMINE HCL 100 MG/ML IJ SOLN
100.0000 mg | Freq: Every day | INTRAMUSCULAR | Status: DC
  Administered 2023-02-25: 100 mg via INTRAVENOUS
  Filled 2023-02-25: qty 2

## 2023-02-25 MED ORDER — THIAMINE MONONITRATE 100 MG PO TABS: 100.0000 mg | ORAL_TABLET | Freq: Every day | ORAL | Status: DC

## 2023-02-25 MED ORDER — LORAZEPAM 2 MG/ML IJ SOLN
INTRAMUSCULAR | Status: AC
  Administered 2023-02-26: 2 mg via INTRAVENOUS
  Filled 2023-02-25: qty 1

## 2023-02-25 MED ORDER — LORAZEPAM 1 MG PO TABS
1.0000 mg | ORAL_TABLET | Freq: Once | ORAL | Status: AC
  Administered 2023-02-25: 1 mg via ORAL
  Filled 2023-02-25: qty 1

## 2023-02-25 MED ORDER — LACTATED RINGERS IV SOLN: INTRAVENOUS | Status: DC

## 2023-02-25 MED ORDER — DEXMEDETOMIDINE HCL IN NACL 400 MCG/100ML IV SOLN
0.0000 ug/kg/h | INTRAVENOUS | Status: DC
  Administered 2023-02-25: 0.6 ug/kg/h via INTRAVENOUS
  Administered 2023-02-25: 0.4 ug/kg/h via INTRAVENOUS
  Filled 2023-02-25 (×2): qty 100

## 2023-02-25 MED ORDER — BENZTROPINE MESYLATE 1 MG PO TABS
1.0000 mg | ORAL_TABLET | Freq: Four times a day (QID) | ORAL | Status: DC | PRN
  Administered 2023-02-25: 1 mg via ORAL
  Filled 2023-02-25: qty 1

## 2023-02-25 MED ORDER — PHENOBARBITAL SODIUM 130 MG/ML IJ SOLN: 65.0000 mg | Freq: Three times a day (TID) | INTRAMUSCULAR | Status: DC

## 2023-02-25 MED ORDER — SODIUM CHLORIDE 0.9 % IV SOLN: INTRAVENOUS | Status: DC

## 2023-02-25 MED ORDER — PHENOBARBITAL SODIUM 130 MG/ML IJ SOLN
130.0000 mg | Freq: Three times a day (TID) | INTRAMUSCULAR | Status: DC
  Administered 2023-02-25 (×2): 130 mg via INTRAVENOUS
  Filled 2023-02-25 (×2): qty 1

## 2023-02-25 MED ORDER — ADULT MULTIVITAMIN W/MINERALS CH
1.0000 | ORAL_TABLET | Freq: Every day | ORAL | Status: DC
  Administered 2023-02-28 – 2023-03-04 (×5): 1 via ORAL
  Filled 2023-02-25 (×6): qty 1

## 2023-02-25 MED ORDER — SODIUM CHLORIDE 0.9 % IV SOLN
260.0000 mg | Freq: Once | INTRAVENOUS | Status: AC
  Administered 2023-02-25: 260 mg via INTRAVENOUS
  Filled 2023-02-25: qty 2

## 2023-02-25 MED ORDER — THIAMINE HCL 100 MG/ML IJ SOLN: 100.0000 mg | Freq: Every day | INTRAMUSCULAR | Status: DC

## 2023-02-25 MED ORDER — LORAZEPAM 1 MG PO TABS: 1.0000 mg | ORAL_TABLET | ORAL | Status: DC | PRN

## 2023-02-25 MED ORDER — DOCUSATE SODIUM 100 MG PO CAPS: 100.0000 mg | ORAL_CAPSULE | Freq: Two times a day (BID) | ORAL | Status: DC | PRN

## 2023-02-25 MED ORDER — DEXMEDETOMIDINE HCL IN NACL 400 MCG/100ML IV SOLN: 0.2000 ug/kg/h | INTRAVENOUS | Status: DC

## 2023-02-25 MED ORDER — LORAZEPAM 2 MG/ML IJ SOLN
0.5000 mg | INTRAMUSCULAR | Status: AC | PRN
  Administered 2023-02-25 – 2023-02-26 (×2): 2 mg via INTRAVENOUS
  Administered 2023-02-28: 1 mg via INTRAVENOUS
  Filled 2023-02-25 (×4): qty 1

## 2023-02-25 MED ORDER — HALOPERIDOL 5 MG PO TABS
5.0000 mg | ORAL_TABLET | Freq: Four times a day (QID) | ORAL | Status: DC | PRN
  Administered 2023-02-25: 5 mg via ORAL
  Filled 2023-02-25: qty 1

## 2023-02-25 MED ORDER — THIAMINE HCL 100 MG/ML IJ SOLN
500.0000 mg | Freq: Three times a day (TID) | INTRAVENOUS | Status: DC
  Administered 2023-02-25 – 2023-02-26 (×4): 500 mg via INTRAVENOUS
  Filled 2023-02-25 (×8): qty 5

## 2023-02-25 MED ORDER — CHLORHEXIDINE GLUCONATE CLOTH 2 % EX PADS
6.0000 | MEDICATED_PAD | Freq: Every day | CUTANEOUS | Status: DC
  Administered 2023-02-25 – 2023-03-03 (×7): 6 via TOPICAL

## 2023-02-25 NOTE — Progress Notes (Addendum)
eLink Physician-Brief Progress Note Patient Name: Anthony [name] DOB: 1966/02/06 MRN: [id number]   Date of Service  02/25/2023  HPI/Events of Note  Patient with severe agitated delirium related to ETOH withdrawal.  eICU Interventions  PRN IV Ativan ordered. 4 point extremity restraints + waist belt to prevent self harm / falls.        Migdalia Dk 02/25/2023, 8:41 PM

## 2023-02-25 NOTE — ED Notes (Signed)
Pt states he wants to be transferred to [facility]

## 2023-02-25 NOTE — Progress Notes (Signed)
Retrieved machine from ED.  Will perform EEG once patient is settled into permanent room.

## 2023-02-25 NOTE — Telephone Encounter (Signed)
The therapist returns a call to Anthony [name]'s wife confirming her identity via two identifiers. She says that Anthony [name] is at the [facility] currently and being sent to the ER expressing her fear that he will be released again. She says that he is being sent there because he has been "hallucinating." The therapist informs her that he will go downstairs to check the status of Anthony [name]'s admission.  After the call concludes, the therapist goes downstairs to the [facility] and is informed that Anthony [name] is being loaded for medical transport to be sent to the ER as his gait in unsteady and he is vomiting such that he would require one-on-one monitoring. The transfer to [facility] ER has been accepted by Dr. Doreatha Martin.  The therapist will apprise his Supervisor, Mr. Everlene Balls, RN, of the situation.  Myrna Blazer, MA, LCSW, [facility], LCAS 02/25/2023

## 2023-02-25 NOTE — Progress Notes (Signed)
EEG complete - results pending 

## 2023-02-25 NOTE — ED Notes (Signed)
Pt noted to agitated and flailing arms in the air.  Wife at bedside.

## 2023-02-25 NOTE — Progress Notes (Signed)
Patient drank 16 ounces of water.

## 2023-02-25 NOTE — ED Notes (Addendum)
After receiving report of this patient this nurse observed patient hallucinating, reaching for things in the air, not responding to staff, and tremors. Cala Bradford, NP assessed pt and advised that pt will need to go to [facility]. Report given to [facility]. Patient transported to [facility] via ambulance. Patient's wife was called and notified.

## 2023-02-25 NOTE — Consult Note (Signed)
NAME:  Anthony [name], MRN:  [id number], DOB:  [birth year], LOS: 0 ADMISSION DATE:  02/25/2023, CONSULTATION DATE: 02/25/2023 REFERRING MD: EDP, CHIEF COMPLAINT: Altered mental status secondary to alcohol withdrawal  History of Present Illness:  57 year old with long history of EtOH abuse coupled with anxiety, depression, suicidal ideation and major depression.  [facility] presents to behavioral [facility] center for alcohol withdrawal but due to tachycardia and hallucinations was transferred to [facility] emergency department.  He been heavily sedated with Ativan.  The plan is to start him on a Precedex drip and admit him to the intensive care unit.  Pertinent  Medical History   Past Medical History:  Diagnosis Date   Alcohol withdrawal syndrome (HCC)    Alcoholism (HCC)    Anxiety    Depression    ED (erectile dysfunction)    GERD (gastroesophageal reflux disease)    Suicidal ideation    Tinnitus      Significant Hospital Events: Including procedures, antibiotic start and stop dates in addition to other pertinent events     Interim History / Subjective:  To be admitted due to alcohol withdrawal and agitation  Objective   Blood pressure (!) 163/95, pulse 96, temperature 98.8 F (37.1 C), temperature source Oral, resp. rate 16, SpO2 97%.       No intake or output data in the 24 hours ending 02/25/23 0924 There were no vitals filed for this visit.  Examination: General: Heavily sedated male he does not follow commands HENT: No JVD or lymphadenopathy is appreciated Lungs: Diminished in the bases Cardiovascular: Heart sounds are regular regular rate rhythm heart rate 108 Abdomen: Soft nontender positive bowel sounds Extremities: Without edema Neuro: Sedated waist arms around intermittently GU: Voids  Resolved Hospital Problem list     Assessment & Plan:  Altered mental status in the setting of alcohol withdrawal now with visual and auditory hallucinations sedated well with  Ativan.  Order for Precedex has been written. Consider phenobarbital rather than Precedex If he goes on Precedex he can go to the intensive care unit Cardiac monitoring Sedation as needed Thiamine and folic acid Electrolytes  History of major depressive disorder Psychiatry involvement once he is stabilized  Hypertension Most likely related to excitation state should improve once properly sedated  Best Practice (right click and "Reselect all SmartList Selections" daily)   Diet/type: NPO DVT prophylaxis: SCD GI prophylaxis: PPI Lines: N/A Foley:  N/A Code Status:  full code Last date of multidisciplinary goals of care discussion [tbd]  Labs   CBC: Recent Labs  Lab 02/20/23 2115 02/25/23 0852  WBC 6.7 5.6  NEUTROABS 3.4 4.1  HGB 15.1 12.5*  HCT 44.0 36.0*  MCV 86.1 84.9  PLT 315 230    Basic Metabolic Panel: Recent Labs  Lab 02/20/23 2115  NA 133*  K 4.0  CL 98  CO2 20*  GLUCOSE 99  BUN 5*  CREATININE 0.88  CALCIUM 9.4  MG 1.9  PHOS 3.4   GFR: Estimated Creatinine Clearance: 92.6 mL/min (by C-G formula based on SCr of 0.88 mg/dL). Recent Labs  Lab 02/20/23 2115 02/25/23 0852  WBC 6.7 5.6    Liver Function Tests: Recent Labs  Lab 02/20/23 2115  AST 30  ALT 20  ALKPHOS 63  BILITOT 0.4  PROT 7.8  ALBUMIN 4.2   Recent Labs  Lab 02/20/23 2115  LIPASE 70*   No results for input(s): "AMMONIA" in the last 168 hours.  ABG No results found for: "PHART", "PCO2ART", "PO2ART", "  HCO3", "TCO2", "ACIDBASEDEF", "O2SAT"   Coagulation Profile: No results for input(s): "INR", "PROTIME" in the last 168 hours.  Cardiac Enzymes: No results for input(s): "CKTOTAL", "CKMB", "CKMBINDEX", "TROPONINI" in the last 168 hours.  HbA1C: No results found for: "HGBA1C"  CBG: No results for input(s): "GLUCAP" in the last 168 hours.  Review of Systems:   na  Past Medical History:  He,  has a past medical history of Alcohol withdrawal syndrome (HCC),  Alcoholism (HCC), Anxiety, Depression, ED (erectile dysfunction), GERD (gastroesophageal reflux disease), Suicidal ideation, and Tinnitus.   Surgical History:   Past Surgical History:  Procedure Laterality Date   LEG SURGERY Right    broken leg   ROTATOR CUFF REPAIR Bilateral      Social History:   reports that he has never smoked. He has never used smokeless tobacco. He reports current alcohol use. He reports that he does not use drugs.   Family History:  His family history includes Diabetes in his mother.   Allergies No Known Allergies   Home Medications  Prior to Admission medications   Medication Sig Start Date End Date Taking? Authorizing Provider  baclofen (LIORESAL) 10 MG tablet Take 1 tablet (10 mg total) by mouth 3 (three) times daily. Patient not taking: Reported on 02/25/2023 01/16/23 01/16/24  Court Joy, PA-C  buPROPion ER [facility] SR) 100 MG 12 hr tablet Take 1 tablet (100 mg total) by mouth 2 (two) times daily. Patient not taking: Reported on 02/25/2023 01/16/23 01/16/24  Court Joy, PA-C  risperiDONE (RISPERDAL) 1 MG tablet Take 1 tablet (1 mg total) by mouth at bedtime. Patient not taking: Reported on 02/25/2023 02/18/23   Court Joy, PA-C  thiamine (VITAMIN B1) 100 MG tablet Take 100 mg by mouth every morning. 11/27/22   [provider]     Critical care time: Elizebeth Brooking Bracken Moffa ACNP Acute Care Nurse Practitioner [facility] Pulmonary/Critical Care Please consult Amion 02/25/2023, 9:25 AM

## 2023-02-25 NOTE — ED Provider Notes (Signed)
FBC/OBS ASAP Discharge Summary  Date and Time: 02/25/2023 7:51 AM  Name: Anthony [name]  MRN:  [id number]   Discharge Diagnoses:  Final diagnoses:  Alcohol abuse  Anxious mood  Alcohol withdrawal delirium (HCC)    Subjective: Patient is non-verbal during evaluation due to withdrawal symptoms  Stay Summary:  Anthony [name] is a 57 year old male, with a history MDD and alcoholism, admitted overnight seeking alcohol detox. Nursing staff notified this writer that patient has been increasingly medically unstable, confused, disoriented to self and situation, with persistent tremors for last few hours. Patient current CIWA score per nursing is 16, and he has responded poorly to antipsychotics and ativan., which were last given 1.5 hr ago.   On evaluation patient is shaking uncontrollably, non responsive to writer attempting to engage in conversation with him. BP remained slightly elevate and patient has been mildly tachycardic. BAL 232. On chart review, patient has no apparent history of seizure related alcohol withdrawal. Patient at this time is unable to provide any medical history. Given active symptoms of delirium, patient is being transferred to the ED for medical evaluation/clearance.  Total Time spent with patient: 30 minutes  Past Psychiatric History: Alcohol Addiction, MDD Past Medical History: Hx of elevated blood pressure without a diagnosis of hypertension Family History: Patient unable to provide history  Family Psychiatric History: Patient unable to provide history Social History: Patient unable to provide social history Tobacco Cessation:  N/A, patient does not currently use tobacco products  Current Medications:  Current Facility-Administered Medications  Medication Dose Route Frequency Provider Last Rate Last Admin   acetaminophen (TYLENOL) tablet 650 mg  650 mg Oral Q6H PRN Sindy Guadeloupe, NP   650 mg at 02/24/23 2045   alum & mag hydroxide-simeth (MAALOX/MYLANTA) 200-200-20  MG/5ML suspension 30 mL  30 mL Oral Q4H PRN Sindy Guadeloupe, NP   30 mL at 02/25/23 0048   haloperidol (HALDOL) tablet 5 mg  5 mg Oral Q6H PRN Sindy Guadeloupe, NP   5 mg at 02/25/23 0051   And   benztropine (COGENTIN) tablet 1 mg  1 mg Oral Q6H PRN Sindy Guadeloupe, NP   1 mg at 02/25/23 0051   hydrOXYzine (ATARAX) tablet 25 mg  25 mg Oral Q6H PRN Sindy Guadeloupe, NP       loperamide (IMODIUM) capsule 2-4 mg  2-4 mg Oral PRN Sindy Guadeloupe, NP   2 mg at 02/24/23 2045   LORazepam (ATIVAN) tablet 1 mg  1 mg Oral Q6H PRN Sindy Guadeloupe, NP   1 mg at 02/25/23 0548   LORazepam (ATIVAN) tablet 1 mg  1 mg Oral QID Sindy Guadeloupe, NP   1 mg at 02/24/23 2100   Followed by   LORazepam (ATIVAN) tablet 1 mg  1 mg Oral TID Sindy Guadeloupe, NP       Followed by   [name] ON 02/26/2023] LORazepam (ATIVAN) tablet 1 mg  1 mg Oral BID Sindy Guadeloupe, NP       Followed by   [name] ON 02/28/2023] LORazepam (ATIVAN) tablet 1 mg  1 mg Oral Daily Sindy Guadeloupe, NP       magnesium hydroxide (MILK OF MAGNESIA) suspension 30 mL  30 mL Oral Daily PRN Sindy Guadeloupe, NP       multivitamin with minerals tablet 1 tablet  1 tablet Oral Daily Sindy Guadeloupe, NP   1 tablet at 02/24/23 2045   OLANZapine zydis (ZYPREXA) disintegrating tablet 10 mg  10 mg Oral Q8H PRN Sindy Guadeloupe,  NP   10 mg at 02/25/23 0547   And   ziprasidone (GEODON) injection 20 mg  20 mg Intramuscular PRN Sindy Guadeloupe, NP       ondansetron (ZOFRAN-ODT) disintegrating tablet 4 mg  4 mg Oral Q6H PRN Sindy Guadeloupe, NP       thiamine (VITAMIN B1) tablet 100 mg  100 mg Oral Daily Sindy Guadeloupe, NP       Current Outpatient Medications  Medication Sig Dispense Refill   thiamine (VITAMIN B1) 100 MG tablet Take 100 mg by mouth every morning.     baclofen (LIORESAL) 10 MG tablet Take 1 tablet (10 mg total) by mouth 3 (three) times daily. (Patient not taking: Reported on 02/25/2023) 90 tablet 1   buPROPion ER (WELLBUTRIN SR) 100 MG 12 hr tablet Take 1 tablet (100 mg total) by mouth  2 (two) times daily. (Patient not taking: Reported on 02/25/2023) 60 tablet 2   risperiDONE (RISPERDAL) 1 MG tablet Take 1 tablet (1 mg total) by mouth at bedtime. (Patient not taking: Reported on 02/25/2023) 30 tablet 0    PTA Medications:  Facility Ordered Medications  Medication   acetaminophen (TYLENOL) tablet 650 mg   alum & mag hydroxide-simeth (MAALOX/MYLANTA) 200-200-20 MG/5ML suspension 30 mL   magnesium hydroxide (MILK OF MAGNESIA) suspension 30 mL   [COMPLETED] thiamine (VITAMIN B1) injection 100 mg   thiamine (VITAMIN B1) tablet 100 mg   multivitamin with minerals tablet 1 tablet   LORazepam (ATIVAN) tablet 1 mg   hydrOXYzine (ATARAX) tablet 25 mg   loperamide (IMODIUM) capsule 2-4 mg   ondansetron (ZOFRAN-ODT) disintegrating tablet 4 mg   LORazepam (ATIVAN) tablet 1 mg   Followed by   LORazepam (ATIVAN) tablet 1 mg   Followed by   [name] ON 02/26/2023] LORazepam (ATIVAN) tablet 1 mg   Followed by   [name] ON 02/28/2023] LORazepam (ATIVAN) tablet 1 mg   OLANZapine zydis (ZYPREXA) disintegrating tablet 10 mg   And   [COMPLETED] LORazepam (ATIVAN) tablet 1 mg   And   ziprasidone (GEODON) injection 20 mg   [COMPLETED] cloNIDine (CATAPRES) tablet 0.1 mg   haloperidol (HALDOL) tablet 5 mg   And   benztropine (COGENTIN) tablet 1 mg   [COMPLETED] LORazepam (ATIVAN) tablet 1 mg   PTA Medications  Medication Sig   thiamine (VITAMIN B1) 100 MG tablet Take 100 mg by mouth every morning.   baclofen (LIORESAL) 10 MG tablet Take 1 tablet (10 mg total) by mouth 3 (three) times daily. (Patient not taking: Reported on 02/25/2023)   buPROPion ER (WELLBUTRIN SR) 100 MG 12 hr tablet Take 1 tablet (100 mg total) by mouth 2 (two) times daily. (Patient not taking: Reported on 02/25/2023)   risperiDONE (RISPERDAL) 1 MG tablet Take 1 tablet (1 mg total) by mouth at bedtime. (Patient not taking: Reported on 02/25/2023)       01/12/2023    2:52 PM  Depression screen PHQ 2/9  Decreased Interest  2  Down, Depressed, Hopeless 1  PHQ - 2 Score 3  Altered sleeping 2  Tired, decreased energy 1  Change in appetite 0  Feeling bad or failure about yourself  1  Trouble concentrating 0  Moving slowly or fidgety/restless 0  Suicidal thoughts 0  PHQ-9 Score 7    Flowsheet Row ED from 02/24/2023 in [facility] Most recent reading at 02/25/2023 12:30 AM ED from 02/20/2023 in [facility] Emergency Department at [facility] Most recent reading at 02/20/2023  8:53 PM ED [facility] Most recent reading at 02/20/2023  6:46 PM  C-SSRS RISK CATEGORY No Risk No Risk No Risk       Musculoskeletal  Strength & Muscle Tone: within normal limits Gait & Station: normal Patient leans: N/A  Psychiatric Specialty Exam  Presentation  General Appearance:  Casual  Eye Contact: Good  Speech: Normal Rate  Speech Volume: Normal  Handedness: Right   Mood and Affect  Mood: Anxious  Affect: Congruent   Thought Process  Thought Processes: Coherent  Descriptions of Associations:Circumstantial  Orientation:Full (Time, Place and Person)  Thought Content:WDL  Diagnosis of Schizophrenia or Schizoaffective disorder in past: No    Hallucinations:Hallucinations: None  Ideas of Reference:None  Suicidal Thoughts:Suicidal Thoughts: No  Homicidal Thoughts:Homicidal Thoughts: No   Sensorium  Memory: Immediate Fair  Judgment: Poor  Insight: Lacking   Executive Functions  Concentration: Fair  Attention Span: Fair  Recall: Fair  Fund of Knowledge: Fair  Language: Fair   Psychomotor Activity  Psychomotor Activity: Psychomotor Activity: Normal   Assets  Assets: Desire for Improvement   Sleep  Sleep: Sleep: Fair Number of Hours of Sleep: 4   Nutritional Assessment (For OBS and FBC admissions only) Has the patient had a weight loss or gain of 10 pounds or more in the last 3  months?: No Has the patient had a decrease in food intake/or appetite?: No Does the patient have dental problems?: No Does the patient have eating habits or behaviors that may be indicators of an eating disorder including binging or inducing vomiting?: No Has the patient recently lost weight without trying?: 0 Has the patient been eating poorly because of a decreased appetite?: 0 Malnutrition Screening Tool Score: 0    Physical Exam  Physical Exam Constitutional:      General: He is not in acute distress.    Appearance: He is ill-appearing.  HENT:     Head: Normocephalic and atraumatic.  Eyes:     Extraocular Movements: Extraocular movements intact.     Pupils: Pupils are equal, round, and reactive to light.  Cardiovascular:     Rate and Rhythm: Tachycardia present.  Pulmonary:     Effort: Pulmonary effort is normal.     Breath sounds: Normal breath sounds.  Neurological:     Mental Status: He is disoriented.     Motor: Tremor present.     Comments: Generalized body tremors        Review of Systems  Unable to perform ROS: Patient nonverbal   Blood pressure (!) 143/113, pulse (!) 110, temperature 97.8 F (36.6 C), temperature source Oral, resp. rate 20, SpO2 99%. There is no height or weight on file to calculate BMI.  Demographic Factors:  Male  Loss Factors: NA  Historical Factors: NA  Risk Reduction Factors:   NA  Continued Clinical Symptoms:  Depression:   Comorbid alcohol abuse/dependence Alcohol/Substance Abuse/Dependencies  Cognitive Features That Contribute To Risk:  None    Suicide Risk:  Minimal: No identifiable suicidal ideation.  Patients presenting with no risk factors but with morbid ruminations; may be classified as minimal risk based on the severity of the depressive symptoms  Plan Of Care/Follow-up recommendations:  Other:  Patient is poorly responding to CIWA protocol and exhibits active delirium. Spoke with Dr. Rush Landmark who accepted  patient to [facility] for medical evaluation due to concerns for delirium. Patient may return to [facility] once medically stable.    Disposition: ED, may return  to [facility] once medically cleared.  Joaquin Courts, NP 02/25/2023, 7:51 AM

## 2023-02-25 NOTE — ED Provider Notes (Signed)
[place] EMERGENCY DEPARTMENT AT [facility] Provider Note   CSN: [id number] Arrival date & time: 02/25/23  4098     History Chief Complaint  Patient presents with   Hallucinations    Anthony [name] is a 57 y.o. male with h/o alcohol use presents to the ER for evaluation of worsening alcohol withdrawal. He was sent here from [facility] for worsening withdrawal symptoms. Patient was becoming altered and having hallucinations with a CIWA score of 16 after receiving Haldol, Zyprexa, Cogentin, and Ativan.  History is limited due to the patient's mental status.  Level 5 caveat.  From the [facility] note, the patient came to [facility] for alcohol detox last night. During his stay there, he became increasingly unstable, confused and disoriented with persistent tremors.  He received multiple antipsychotics without much response.  He was verbally not responsive.  He has had history of seizure from alcohol withdrawal previously.  HPI     Home Medications Prior to Admission medications   Medication Sig Start Date End Date Taking? Authorizing Provider  baclofen (LIORESAL) 10 MG tablet Take 1 tablet (10 mg total) by mouth 3 (three) times daily. Patient not taking: Reported on 02/25/2023 01/16/23 01/16/24  Court Joy, PA-C  buPROPion ER [facility] SR) 100 MG 12 hr tablet Take 1 tablet (100 mg total) by mouth 2 (two) times daily. Patient not taking: Reported on 02/25/2023 01/16/23 01/16/24  Court Joy, PA-C  risperiDONE (RISPERDAL) 1 MG tablet Take 1 tablet (1 mg total) by mouth at bedtime. Patient not taking: Reported on 02/25/2023 02/18/23   Court Joy, PA-C  thiamine (VITAMIN B1) 100 MG tablet Take 100 mg by mouth every morning. 11/27/22   [provider]      Allergies    Patient has no known allergies.    Review of Systems   Review of Systems  Unable to perform ROS: Mental status change    Physical Exam Updated Vital Signs BP (!) 163/95 (BP Location: Left Arm)   Pulse  96   Temp 98.8 F (37.1 C) (Oral)   Resp 16   SpO2 97%  Physical Exam Vitals and nursing note reviewed.  Constitutional:      Appearance: He is ill-appearing. He is not diaphoretic.     Comments: Open eyes to command  HENT:     Head: Normocephalic and atraumatic.     Mouth/Throat:     Mouth: Mucous membranes are dry.  Eyes:     General: No scleral icterus. Cardiovascular:     Rate and Rhythm: Tachycardia present.  Pulmonary:     Effort: Pulmonary effort is normal. No respiratory distress.     Breath sounds: Normal breath sounds.  Abdominal:     Palpations: Abdomen is soft.     Tenderness: There is no abdominal tenderness.     Comments: Does not have painful reaction to abdominal palpation.   Skin:    General: Skin is warm and dry.  Neurological:     GCS: GCS eye subscore is 3. GCS verbal subscore is 1. GCS motor subscore is 5.     Motor: No tremor.     Comments: Moving all extremities. Will open eyes to command, but not moving extremities to command. Will sporatically move extremities. Moving his legs spontaneously. Appears to have some chorea in the upper extremities L>R. PERRL. No facial droop noted.      ED Results / Procedures / Treatments   Labs (all labs ordered are listed, but  only abnormal results are displayed) Labs Reviewed  ETHANOL  CBC WITH DIFFERENTIAL/PLATELET  COMPREHENSIVE METABOLIC PANEL  AMMONIA  URINALYSIS, ROUTINE W REFLEX MICROSCOPIC  RAPID URINE DRUG SCREEN, HOSP PERFORMED    EKG None  Radiology No results found.  Procedures Procedures   Medications Ordered in ED Medications  LORazepam (ATIVAN) tablet 1-4 mg (has no administration in time range)    Or  LORazepam (ATIVAN) injection 1-4 mg (has no administration in time range)  thiamine (VITAMIN B1) tablet 100 mg (has no administration in time range)    Or  thiamine (VITAMIN B1) injection 100 mg (has no administration in time range)  folic acid (FOLVITE) tablet 1 mg (has no  administration in time range)  multivitamin with minerals tablet 1 tablet (has no administration in time range)  dexmedetomidine (PRECEDEX) 400 MCG/100ML (4 mcg/mL) infusion (has no administration in time range)  lactated ringers bolus 1,000 mL (has no administration in time range)    ED Course/ Medical Decision Making/ A&P  Medical Decision Making Amount and/or Complexity of Data Reviewed Labs: ordered. Radiology: ordered.  Risk OTC drugs. Prescription drug management. Decision regarding hospitalization.   57 y.o. male presents to the ER for evaluation of hallucinations, worsening withdrawal. Differential diagnosis includes but is not limited to Drug-related, hypoxia, hyper/hypoglycemia, encephalopathy, sepsis, DKA/HHS, brain lesion, CVA, seizure, environmental, psychiatric, DT, alcohol withdrawal. Vital signs elevated BP at 161/98, otherwise unremarkable. Physical exam as noted above.   Patient was [facility] over here from [facility] urgent care.  From EMS he had nasal cannula on however he was not hypoxic.  Has not been on any oxygen while here.  From reading the previous note, the patient was given multiple medications including Haldol, Cogentin, Zyprexa, and Ativan and was having worsening withdrawal symptoms and started hallucinating and was acting altered.  CIWA score 16.  He was sent over here for evaluation.    Given the patient has been on multiple medications, can consider Precedex at this time.  I consulted critical care medicine who came and evaluated the patient at bedside.  They recommend holding off of Precedex right now but will determine whether medical admission versus critical care admission at this time.  I independently reviewed and interpreted the patient's labs. Ethanol < 10. Ammonia within normal limits. CBC shows mild anemia with 12.5, but his hemoglobin was 15.1 5 days prior. CMP shows hyponatremia at 126, glucose at 103, chloride 87, calcium 8.5.  Mildly  elevated AST at 67.  Total bili of 2.3.  Other labs are pending at this time  Imaging and other labs still pending at this time,   Portions of this report may have been transcribed using voice recognition software. Every effort was made to ensure accuracy; however, inadvertent computerized transcription errors may be present.   I discussed this case with my attending physician who cosigned this note including patient's presenting symptoms, physical exam, and planned diagnostics and interventions. Attending physician stated agreement with plan or made changes to plan which were implemented.   Attending physician assessed patient at bedside.  Final Clinical Impression(s) / ED Diagnoses Final diagnoses:  Alcohol withdrawal syndrome, with delirium [facility])    Rx / DC Orders ED Discharge Orders     None         Achille Rich, PA-C 02/25/23 1106    Tegeler, Canary Brim, MD 02/25/23 [phone]

## 2023-02-25 NOTE — ED Notes (Signed)
Pt continues to be a high fall risk, confused. 1-1 sitter, MHT with pt at present.  NP Sindy Guadeloupe and [facility] notified.  Monitoring for safety.

## 2023-02-25 NOTE — ED Notes (Signed)
Pt stumbling while walking and stated "its getting worse"

## 2023-02-25 NOTE — ED Triage Notes (Signed)
Pt BIBEMS from [facility] with facility staff reporting hallucinations and alcohol withdrawal. Unknown when last drink. Pt received multiple medications at facility and staff felt it was appropriate for further eval in ED. Pt arrived sedated from medication, VSS/NAD.

## 2023-02-25 NOTE — Discharge Instructions (Signed)
Discharge to Ed for medical evaluation due to active alcohol withdrawal symptoms

## 2023-02-25 NOTE — ED Notes (Signed)
Intensivist at bedside.

## 2023-02-25 NOTE — H&P (Signed)
02/25/2023 See consult note today.  Myrla Halsted MD PCCM
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
## 2023-02-25 NOTE — Progress Notes (Signed)
Will perform EEG once patient comes back from CT.

## 2023-02-25 NOTE — ED Notes (Signed)
Pt is reaching in the air for things not there he is actively hallucinating he is pulling on the writer pt cannot stand or hold a cup of water without assistance pt is getting agitated

## 2023-02-25 NOTE — Procedures (Signed)
Patient Name: Anthony [name]  MRN: [id number]  Epilepsy Attending: Charlsie Quest  Referring Physician/Provider: Minor, Vilinda Blanks, NP  Date: 02/25/2023 Duration: 23.11 mins  Patient history: 57yo m with ams getting eeg to evaluate for seizure  Level of alertness: asleep/lethargic  AEDs during EEG study: Phenobarb  Technical aspects: This EEG study was done with scalp electrodes positioned according to the 10-20 International system of electrode placement. Electrical activity was reviewed with band pass filter of 1-70Hz , sensitivity of 7 uV/mm, display speed of 35mm/sec with a 60Hz  notched filter applied as appropriate. EEG data were recorded continuously and digitally stored.  Video monitoring was available and reviewed as appropriate.  Description: EEG showed continuous generalized 3 to 5 Hz theta-delta slowing admixed with 13-15hz  beta activity. Hyperventilation and photic stimulation were not performed.     ABNORMALITY - Continuous slow, generalized  IMPRESSION: This study is suggestive of moderate to severe diffuse encephalopathy, nonspecific etiology but likely related to benzodiazepine use. No seizures or epileptiform discharges were seen throughout the recording.  Ezell Poke Annabelle Harman

## 2023-02-25 NOTE — ED Notes (Signed)
Pt sleeping@this time. Breathing even and unlabored. Will continue to monitor for safety 

## 2023-02-25 NOTE — Progress Notes (Signed)
Patient has significant history and displays significant signs/symptoms of esophageal and epigastric discomfort.  Patient most likely needs to have scheduled medications to address these complaints, rather than current PRN ordered medications.

## 2023-02-26 ENCOUNTER — Encounter (HOSPITAL_COMMUNITY): Payer: Self-pay | Admitting: Internal Medicine

## 2023-02-26 DIAGNOSIS — R319 Hematuria, unspecified: Secondary | ICD-10-CM | POA: Diagnosis not present

## 2023-02-26 DIAGNOSIS — G9341 Metabolic encephalopathy: Secondary | ICD-10-CM

## 2023-02-26 DIAGNOSIS — E871 Hypo-osmolality and hyponatremia: Secondary | ICD-10-CM | POA: Diagnosis not present

## 2023-02-26 DIAGNOSIS — F10931 Alcohol use, unspecified with withdrawal delirium: Secondary | ICD-10-CM | POA: Diagnosis not present

## 2023-02-26 LAB — CBC
HCT: 40.1 % (ref 39.0–52.0)
Hemoglobin: 13.7 g/dL (ref 13.0–17.0)
MCH: 30 pg (ref 26.0–34.0)
MCHC: 34.2 g/dL (ref 30.0–36.0)
MCV: 87.9 fL (ref 80.0–100.0)
Platelets: 184 10*3/uL (ref 150–400)
RBC: 4.56 MIL/uL (ref 4.22–5.81)
RDW: 13 % (ref 11.5–15.5)
WBC: 11.4 10*3/uL — ABNORMAL HIGH (ref 4.0–10.5)
nRBC: 0 % (ref 0.0–0.2)

## 2023-02-26 LAB — RENAL FUNCTION PANEL
Albumin: 3.5 g/dL (ref 3.5–5.0)
Anion gap: 12 (ref 5–15)
BUN: 7 mg/dL (ref 6–20)
CO2: 26 mmol/L (ref 22–32)
Calcium: 9.5 mg/dL (ref 8.9–10.3)
Chloride: 100 mmol/L (ref 98–111)
Creatinine, Ser: 0.82 mg/dL (ref 0.61–1.24)
GFR, Estimated: >60 mL/min (ref >60)
Glucose, Bld: 106 mg/dL — ABNORMAL HIGH (ref 70–99)
Phosphorus: 3.1 mg/dL (ref 2.5–4.6)
Potassium: 4.2 mmol/L (ref 3.5–5.1)
Sodium: 138 mmol/L (ref 135–145)

## 2023-02-26 LAB — GLUCOSE, CAPILLARY
Glucose-Capillary: 100 mg/dL — ABNORMAL HIGH (ref 70–99)
Glucose-Capillary: 101 mg/dL — ABNORMAL HIGH (ref 70–99)
Glucose-Capillary: 102 mg/dL — ABNORMAL HIGH (ref 70–99)
Glucose-Capillary: 116 mg/dL — ABNORMAL HIGH (ref 70–99)
Glucose-Capillary: 118 mg/dL — ABNORMAL HIGH (ref 70–99)
Glucose-Capillary: 90 mg/dL (ref 70–99)

## 2023-02-26 MED ORDER — PHENOBARBITAL SODIUM 130 MG/ML IJ SOLN
130.0000 mg | Freq: Once | INTRAMUSCULAR | Status: AC
  Administered 2023-02-26: 130 mg via INTRAVENOUS
  Filled 2023-02-26: qty 1

## 2023-02-26 MED ORDER — ENOXAPARIN SODIUM 40 MG/0.4ML IJ SOSY
40.0000 mg | PREFILLED_SYRINGE | INTRAMUSCULAR | Status: DC
  Administered 2023-02-26 – 2023-03-04 (×7): 40 mg via SUBCUTANEOUS
  Filled 2023-02-26 (×7): qty 0.4

## 2023-02-26 MED ORDER — THIAMINE HCL 100 MG/ML IJ SOLN
100.0000 mg | Freq: Every day | INTRAMUSCULAR | Status: DC
  Administered 2023-03-04: 100 mg via INTRAVENOUS
  Filled 2023-02-26: qty 2

## 2023-02-26 MED ORDER — THIAMINE HCL 100 MG/ML IJ SOLN
250.0000 mg | Freq: Every day | INTRAVENOUS | Status: AC
  Administered 2023-02-27 – 2023-03-03 (×5): 250 mg via INTRAVENOUS
  Filled 2023-02-26 (×5): qty 2.5

## 2023-02-26 MED ORDER — MIDAZOLAM HCL 2 MG/2ML IJ SOLN: 2.0000 mg | Freq: Four times a day (QID) | INTRAMUSCULAR | Status: DC

## 2023-02-26 MED ORDER — THIAMINE MONONITRATE 100 MG PO TABS: 100.0000 mg | ORAL_TABLET | Freq: Every day | ORAL | Status: DC

## 2023-02-26 MED ORDER — SODIUM CHLORIDE 0.9 % IV SOLN
1.0000 mg | Freq: Every day | INTRAVENOUS | Status: DC
  Administered 2023-02-26: 1 mg via INTRAVENOUS
  Filled 2023-02-26 (×2): qty 0.2

## 2023-02-26 MED ORDER — SODIUM CHLORIDE 0.9 % IV SOLN
260.0000 mg | Freq: Three times a day (TID) | INTRAVENOUS | Status: DC
  Administered 2023-02-26 – 2023-02-27 (×3): 260 mg via INTRAVENOUS
  Filled 2023-02-26 (×5): qty 2

## 2023-02-26 MED ORDER — ORAL CARE MOUTH RINSE
15.0000 mL | OROMUCOSAL | Status: DC
  Administered 2023-02-26 – 2023-03-03 (×20): 15 mL via OROMUCOSAL

## 2023-02-26 MED ORDER — LACTATED RINGERS IV SOLN: INTRAVENOUS | Status: AC

## 2023-02-26 MED ORDER — THIAMINE HCL 100 MG/ML IJ SOLN: 100.0000 mg | Freq: Every day | INTRAMUSCULAR | Status: DC

## 2023-02-26 MED ORDER — THIAMINE HCL 100 MG/ML IJ SOLN
500.0000 mg | Freq: Three times a day (TID) | INTRAVENOUS | Status: AC
  Administered 2023-02-26 – 2023-02-27 (×2): 500 mg via INTRAVENOUS
  Filled 2023-02-26 (×2): qty 5

## 2023-02-26 MED ORDER — ORAL CARE MOUTH RINSE: 15.0000 mL | OROMUCOSAL | Status: DC | PRN

## 2023-02-26 NOTE — Plan of Care (Signed)
  Problem: Clinical Measurements: Goal: Ability to maintain clinical measurements within normal limits will improve Outcome: Progressing Goal: Will remain free from infection Outcome: Progressing Goal: Diagnostic test results will improve Outcome: Progressing Goal: Respiratory complications will improve Outcome: Progressing Goal: Cardiovascular complication will be avoided Outcome: Progressing   Problem: Activity: Goal: Risk for activity intolerance will decrease Outcome: Progressing   Problem: Coping: Goal: Level of anxiety will decrease Outcome: Progressing   Problem: Elimination: Goal: Will not experience complications related to urinary retention Outcome: Progressing   Problem: Safety: Goal: Ability to remain free from injury will improve Outcome: Progressing   Problem: Skin Integrity: Goal: Risk for impaired skin integrity will decrease Outcome: Progressing

## 2023-02-26 NOTE — Telephone Encounter (Signed)
ADDENDUM [facility] 02/26/2023 CD IOP  Chart review: :NAME:  Anthony [name], MRN:  [id number], DOB:  06/14/1966, LOS: 1 ADMISSION DATE:  02/25/2023, CONSULTATION DATE: 02/25/2023 REFERRING MD: EDP, CHIEF COMPLAINT: Altered mental status secondary to alcohol withdrawal  This patient is critically ill with multiple organ system failure which requires frequent high complexity decision making, assessment, support, evaluation, and titration of therapies. This was completed through the application of advanced monitoring technologies and extensive interpretation of multiple databases. During this encounter critical care time was devoted to patient care services described in this note for 34 minutes.  Steffanie Dunn, DO 02/26/23 8:51 AM [facility]  Pt remains in critical condition from [facility] but vital signs appear to be normalizing. Counselor reveiwed this inpatient record as well. His prognosis is guarded from the standpoint of DTs. Counselor will continue to communicate/support wife. Appropriate Level of Care for his alcohol dependence will be determined when/if he is discharged which is our hope.

## 2023-02-26 NOTE — Progress Notes (Signed)
Pt w/ polyuria since foley insertion w/ average UOP ~200/hr. Warrick Parisian, MD notified. Orders to continue to monitor.

## 2023-02-26 NOTE — Progress Notes (Signed)
1100 - Pt only arouses slightly to pain. Precedex slowly being titrated down  1400 - pt now with RASS +1/+2 and follows simple commands intermittently, however is only oriented to self, has mostly incomprehensible speech and appears to still have some hallucinations.

## 2023-02-26 NOTE — Consult Note (Signed)
   Anthony [name] is a 57 year old male, with a history MDD and alcoholism, admitted overnight seeking alcohol detox. Nursing staff notified this writer that patient has been increasingly medically unstable, confused, disoriented to self and situation, with persistent tremors for last few hours. Patient current CIWA score per nursing is 16, and he has responded poorly to antipsychotics and ativan., which were last given 1.5 hr ago.  He initially presented to the [facility] but was transferred for reasons listed above. Thus a psychiatric consult was placed as he required an admission.   Patient remains obtunded and sedated on Precedex and Phenobarbital. Please refer to collateral obtained from wife. At any rate will remain signed on and follow from a distance until patient is able to participate in psychiatric evaluation. I suspect he has long days ahead.   Plan: Agree with primary team to continue with treatment with phenobarb and or precedex. I worry that the clinical presentation raises concerns for Wernicke's Encephalopathy, characterized by an unstable gait and altered mental status, rather than delirium tremens (DTs). Thiamine, B12 and folate has been obtained. Continue IV Thiamine 500mg  q8hr x 6 doses.  Continue to monitor the patient's condition and mental status closely. Consider psychiatric evaluation post-CT if the patient becomes alert and able to participate. Engage a Child psychotherapist to address immediate safety and support needs, particularly regarding his substance use and familial concerns. Discuss potential interventions and safety plans with his wife, ensuring support systems are activated. Notes: Documentation to be updated following CT results and further assessments.   Patient does meet criteria for IVC, in the event he attempts to leave. Patient endorse recent suicidal ideations, caucasian male, recent loss (Stepdad and job), alcohol use, and previous existing mental illness.

## 2023-02-26 NOTE — Progress Notes (Signed)
NAME:  SAAKETH GOODLET, MRN:  [id number], DOB:  04-14-1966, LOS: 1 ADMISSION DATE:  02/25/2023, CONSULTATION DATE: 02/25/2023 REFERRING MD: EDP, CHIEF COMPLAINT: Altered mental status secondary to alcohol withdrawal  History of Present Illness:  57 year old with long history of EtOH abuse coupled with anxiety, depression, suicidal ideation and major depression.  Originally presents to behavioral health center for alcohol withdrawal but due to tachycardia and hallucinations was transferred to [facility] emergency department.  He been heavily sedated with Ativan.  The plan is to start him on a Precedex drip and admit him to the intensive care unit.  Pertinent  Medical History   Past Medical History:  Diagnosis Date   Alcohol withdrawal syndrome (HCC)    Alcoholism (HCC)    Anxiety    Depression    ED (erectile dysfunction)    GERD (gastroesophageal reflux disease)    Suicidal ideation    Tinnitus      Significant Hospital Events: Including procedures, antibiotic start and stop dates in addition to other pertinent events   8/21 admitted to ICU, started on phenobarbital and precedex  Interim History / Subjective:  Overnight required restraints, remains on precedex infusion.  Objective   Blood pressure (!) 141/97, pulse 76, temperature 98.4 F (36.9 C), temperature source Axillary, resp. rate (!) 25, weight 70.8 kg, SpO2 94%.        Intake/Output Summary (Last 24 hours) at 02/26/2023 0846 Last data filed at 02/26/2023 8469 Gross per 24 hour  Intake 2150.97 ml  Output 6255 ml  Net -4104.03 ml   Filed Weights   02/25/23 1249  Weight: 70.8 kg    Examination: General: middle aged man lying in bed sleeping, on precedex infusion HENT:  [place]/AT Lungs: breathing comfortably on [place], CTAB Cardiovascular: S1S2, RRR Abdomen: hypoactive bowel sounds, soft, NT Extremities: appropriate muscle mass for age, no c/c/e Neuro: RASS -5 GU: foley with amber urine  Potassium 4.2 Bicarb 26 BUN  7 Creatinine 0.82 WBC 11.4 H/H13.7/40.1 Platelets 184 folate and B12 within normal limits UA 0-5 WBCs, greater than 50 RBCs Right upper quadrant ultrasound-steatosis, no cholelithiasis CXR personally reviewed-no infiltrates or masses CT head personally reviewed-no acute abnormalities  Resolved Hospital Problem list     Assessment & Plan:  Acute metabolic encephalopathy due to ETOH withdrawal; visual and auditory hallucinations and agressive behavior.  -increase phenobarbital, no taper yet until not needing precedex -wean down precedex today as able; con't ICU monitoring for this medication -if not abel to come off preceedx on phenobarb, may be forced to add scheduled benzos -monitor on tele -vitamins -LR today while not awake; can advance diet as tolerated once he is able to be more awake and cooperative -Will counsel on the importance of cessation when appropriate.   History of major depressive disorder -need to consult Psych once medically stable.   Hypertension -monitor, can start losartan if needed for persistent hypertension  Hyponatremia; corrected too quickly-- suspect he had low solute intake PTA due to beer drinking and increased with LR.  -avoid correcting further  Hematuria; had traumatic foley - Needs repeat UA when more stable to ensure resolution  Wife updated at bedside during rounds. All questions were answered.   Best Practice (right click and "Reselect all SmartList Selections" daily)   Diet/type: NPO> can advance as tolerated DVT prophylaxis: LMWH GI prophylaxis: N/A Lines: N/A Foley:  N/A Code Status:  full code Last date of multidisciplinary goals of care discussion [wife updated at bedside 8/22]  Labs  CBC: Recent Labs  Lab 02/20/23 2115 02/25/23 0852 02/26/23 0634  WBC 6.7 5.6 11.4*  NEUTROABS 3.4 4.1  --   HGB 15.1 12.5* 13.7  HCT 44.0 36.0* 40.1  MCV 86.1 84.9 87.9  PLT 315 230 184    Basic Metabolic Panel: Recent Labs  Lab  02/20/23 2115 02/25/23 0852 02/26/23 0634  NA 133* 126* 138  K 4.0 3.9 4.2  CL 98 87* 100  CO2 20* 24 26  GLUCOSE 99 103* 106*  BUN 5* 6 7  CREATININE 0.88 0.83 0.82  CALCIUM 9.4 8.5* 9.5  MG 1.9  --   --   PHOS 3.4  --  3.1   GFR: Estimated Creatinine Clearance: 99.4 mL/min (by C-G formula based on SCr of 0.82 mg/dL). Recent Labs  Lab 02/20/23 2115 02/25/23 0852 02/26/23 0634  WBC 6.7 5.6 11.4*    Liver Function Tests: Recent Labs  Lab 02/20/23 2115 02/25/23 0852 02/26/23 0634  AST 30 67*  --   ALT 20 31  --   ALKPHOS 63 56  --   BILITOT 0.4 2.3*  --   PROT 7.8 6.6  --   ALBUMIN 4.2 3.7 3.5   Critical care time:      This patient is critically ill with multiple organ system failure which requires frequent high complexity decision making, assessment, support, evaluation, and titration of therapies. This was completed through the application of advanced monitoring technologies and extensive interpretation of multiple databases. During this encounter critical care time was devoted to patient care services described in this note for 34 minutes.  Steffanie Dunn, DO 02/26/23 8:51 AM [facility]  For contact information, see Amion. If no response to pager, please call PCCM consult pager. After hours, 7PM- 7AM, please call Elink.

## 2023-02-27 DIAGNOSIS — F1994 Other psychoactive substance use, unspecified with psychoactive substance-induced mood disorder: Secondary | ICD-10-CM | POA: Diagnosis not present

## 2023-02-27 DIAGNOSIS — F1020 Alcohol dependence, uncomplicated: Secondary | ICD-10-CM

## 2023-02-27 DIAGNOSIS — R410 Disorientation, unspecified: Secondary | ICD-10-CM | POA: Diagnosis not present

## 2023-02-27 DIAGNOSIS — G9340 Encephalopathy, unspecified: Secondary | ICD-10-CM | POA: Diagnosis not present

## 2023-02-27 LAB — RENAL FUNCTION PANEL
Albumin: 3.4 g/dL — ABNORMAL LOW (ref 3.5–5.0)
Anion gap: 16 — ABNORMAL HIGH (ref 5–15)
BUN: 7 mg/dL (ref 6–20)
CO2: 24 mmol/L (ref 22–32)
Calcium: 9.4 mg/dL (ref 8.9–10.3)
Chloride: 98 mmol/L (ref 98–111)
Creatinine, Ser: 0.95 mg/dL (ref 0.61–1.24)
GFR, Estimated: >60 mL/min (ref >60)
Glucose, Bld: 96 mg/dL (ref 70–99)
Phosphorus: 3.2 mg/dL (ref 2.5–4.6)
Potassium: 3.5 mmol/L (ref 3.5–5.1)
Sodium: 138 mmol/L (ref 135–145)

## 2023-02-27 LAB — GLUCOSE, CAPILLARY
Glucose-Capillary: 85 mg/dL (ref 70–99)
Glucose-Capillary: 101 mg/dL — ABNORMAL HIGH (ref 70–99)
Glucose-Capillary: 83 mg/dL (ref 70–99)

## 2023-02-27 MED ORDER — FOLIC ACID 5 MG/ML IJ SOLN
1.0000 mg | Freq: Every day | INTRAMUSCULAR | Status: DC
  Administered 2023-02-27: 1 mg via INTRAVENOUS
  Filled 2023-02-27 (×2): qty 0.2

## 2023-02-27 MED ORDER — BETHANECHOL CHLORIDE 10 MG PO TABS
10.0000 mg | ORAL_TABLET | Freq: Three times a day (TID) | ORAL | Status: DC
  Administered 2023-02-27 – 2023-03-03 (×15): 10 mg via ORAL
  Filled 2023-02-27 (×18): qty 1

## 2023-02-27 MED ORDER — POTASSIUM CHLORIDE CRYS ER 20 MEQ PO TBCR
40.0000 meq | EXTENDED_RELEASE_TABLET | Freq: Once | ORAL | Status: AC
  Administered 2023-02-27: 40 meq via ORAL
  Filled 2023-02-27: qty 2

## 2023-02-27 MED ORDER — POTASSIUM CHLORIDE 10 MEQ/100ML IV SOLN: 10.0000 meq | INTRAVENOUS | Status: DC

## 2023-02-27 MED ORDER — PHENOBARBITAL 32.4 MG PO TABS
32.4000 mg | ORAL_TABLET | Freq: Three times a day (TID) | ORAL | Status: DC
  Administered 2023-03-03 – 2023-03-04 (×4): 32.4 mg via ORAL
  Filled 2023-02-27 (×4): qty 1

## 2023-02-27 MED ORDER — POTASSIUM CHLORIDE 10 MEQ/100ML IV SOLN
10.0000 meq | INTRAVENOUS | Status: AC
  Administered 2023-02-27 (×4): 10 meq via INTRAVENOUS
  Filled 2023-02-27 (×5): qty 100

## 2023-02-27 MED ORDER — PHENOBARBITAL 32.4 MG PO TABS
97.2000 mg | ORAL_TABLET | Freq: Three times a day (TID) | ORAL | Status: AC
  Administered 2023-02-27 – 2023-03-01 (×6): 97.2 mg via ORAL
  Filled 2023-02-27 (×6): qty 3

## 2023-02-27 MED ORDER — PHENOBARBITAL 32.4 MG PO TABS
64.8000 mg | ORAL_TABLET | Freq: Three times a day (TID) | ORAL | Status: AC
  Administered 2023-03-01 – 2023-03-02 (×5): 64.8 mg via ORAL
  Filled 2023-02-27 (×5): qty 2

## 2023-02-27 NOTE — Progress Notes (Signed)
NAME:  Anthony [name], MRN:  [id number], DOB:  [birth year], LOS: 2 ADMISSION DATE:  02/25/2023, CONSULTATION DATE: 02/25/2023 REFERRING MD: EDP, CHIEF COMPLAINT: Altered mental status secondary to alcohol withdrawal  History of Present Illness:  57 year old with long history of EtOH abuse coupled with anxiety, depression, suicidal ideation and major depression.  Originally presents to behavioral health center for alcohol withdrawal but due to tachycardia and hallucinations was transferred to [facility] emergency department.  He been heavily sedated with Ativan.  The plan is to start him on a Precedex drip and admit him to the intensive care unit.  Pertinent  Medical History   Past Medical History:  Diagnosis Date   Alcohol withdrawal syndrome (HCC)    Alcoholism (HCC)    Anxiety    Depression    ED (erectile dysfunction)    GERD (gastroesophageal reflux disease)    Suicidal ideation    Tinnitus    Significant Hospital Events: Including procedures, antibiotic start and stop dates in addition to other pertinent events   8/21 admitted to ICU, started on phenobarbital and precedex  Interim History / Subjective:  No overnight events Remains on Precedex Awake and easily arousable  Objective   Blood pressure (!) 138/108, pulse 99, temperature 98.5 F (36.9 C), temperature source Axillary, resp. rate 19, weight 68.6 kg, SpO2 95%.        Intake/Output Summary (Last 24 hours) at 02/27/2023 0756 Last data filed at 02/27/2023 4782 Gross per 24 hour  Intake 1799.18 ml  Output 1896 ml  Net -96.82 ml   Filed Weights   02/25/23 1249 02/27/23 0500  Weight: 70.8 kg 68.6 kg    Examination: General: Middle-age gentleman, does not appear to be in acute distress  HENT: Moist oral mucosa Lungs: Clear breath sounds to auscultation Cardiovascular: S1-S2 appreciated with no murmur Abdomen: Bowel sounds appreciated Extremities: moving all extremities Neuro: RASS -1 GU: Foley in place   I  reviewed nursing notes, last 24 h vitals and pain scores, last 48 h intake and output, last 24 h labs and trends, and last 24 h imaging results.  Right upper quadrant ultrasound-steatosis, no cholelithiasis  Chest x-ray with no infiltrate-reviewed by myself CT head personally reviewed-no acute abnormalities  Resolved Hospital Problem list     Assessment & Plan:   EtOH withdrawal Metabolic encephalopathy Aggressive behavior -On phenobarbital -On Precedex -Multivitamins  History of depression -Consult psych when more medically stable  Hypertension -Was on losartan, resume when appropriate  Hyponatremia -Continue to monitor closely  Hematuria -Secondary traumatic Foley -Will continue to monitor  Best Practice (right click and "Reselect all SmartList Selections" daily)   Diet/type: NPO> can advance as tolerated DVT prophylaxis: LMWH GI prophylaxis: N/A Lines: N/A Foley:  N/A Code Status:  full code Last date of multidisciplinary goals of care discussion [wife updated at bedside 8/22]  Labs   CBC: Recent Labs  Lab 02/20/23 2115 02/25/23 0852 02/26/23 0634  WBC 6.7 5.6 11.4*  NEUTROABS 3.4 4.1  --   HGB 15.1 12.5* 13.7  HCT 44.0 36.0* 40.1  MCV 86.1 84.9 87.9  PLT 315 230 184    Basic Metabolic Panel: Recent Labs  Lab 02/20/23 2115 02/25/23 0852 02/26/23 0634 02/27/23 0425  NA 133* 126* 138 138  K 4.0 3.9 4.2 3.5  CL 98 87* 100 98  CO2 20* 24 26 24   GLUCOSE 99 103* 106* 96  BUN 5* 6 7 7   CREATININE 0.88 0.83 0.82 0.95  CALCIUM 9.4 8.5*  9.5 9.4  MG 1.9  --   --   --   PHOS 3.4  --  3.1 3.2   GFR: Estimated Creatinine Clearance: 83.2 mL/min (by C-G formula based on SCr of 0.95 mg/dL). Recent Labs  Lab 02/20/23 2115 02/25/23 0852 02/26/23 0634  WBC 6.7 5.6 11.4*    Liver Function Tests: Recent Labs  Lab 02/20/23 2115 02/25/23 0852 02/26/23 0634 02/27/23 0425  AST 30 67*  --   --   ALT 20 31  --   --   ALKPHOS 63 56  --   --    BILITOT 0.4 2.3*  --   --   PROT 7.8 6.6  --   --   ALBUMIN 4.2 3.7 3.5 3.4*   The patient is critically ill with multiple organ systems failure and requires high complexity decision making for assessment and support, frequent evaluation and titration of therapies, application of advanced monitoring technologies and extensive interpretation of multiple databases. Critical Care Time devoted to patient care services described in this note independent of APP/resident time (if applicable)  is 31 minutes.   Virl Diamond MD [facility] Personal pager: See Amion If unanswered, please page CCM On-call: #[phone]

## 2023-02-27 NOTE — TOC Initial Note (Signed)
Transition of Care [facility]) - Initial/Assessment Note    Patient Details  Name: Anthony [name] MRN: [id number] Date of Birth: 1966-02-22  [facility] CM/SW Contact:    Lorri Frederick, LCSW Phone Number: 02/27/2023, 12:49 PM  Clinical Narrative:    CSW met with pt and wife  [name] re:  substance abuse resources.     Pt oriented x2, remains confused, all information from wife.  Pt from home with wife.  Daily ETOH use.  Pt has been working with counselor at [facility] with a plan to enter detox prior to this admission.  Pt has history of prior substance abuse treatment, both residential and outpt.  CSW encouraged wife to contact counselor at [facility] to discuss treatment options at discharge and she verbalized willingness to do this.    TOC will continue to follow.        Expected Discharge Plan: Home/Self Care Barriers to Discharge: Continued Medical Work up   Patient Goals and CMS Choice            Expected Discharge Plan and Services In-house Referral: Clinical Social Work     Living arrangements for the past 2 months: Single Family Home                                      Prior Living Arrangements/Services Living arrangements for the past 2 months: Single Family Home Lives with:: Spouse Patient language and need for interpreter reviewed:: Yes        Need for Family Participation in Patient Care: Yes (Comment) Care giver support system in place?: Yes (comment)   Criminal Activity/Legal Involvement Pertinent to Current Situation/Hospitalization: No - Comment as needed  Activities of Daily Living      Permission Sought/Granted                  Emotional Assessment Appearance:: Appears stated age Attitude/Demeanor/Rapport: Lethargic Affect (typically observed): Unable to Assess Orientation: : Oriented to Self, Oriented to Place Alcohol / Substance Use: Alcohol Use    Admission diagnosis:  Alcohol withdrawal syndrome, with delirium (HCC)  [F10.931] AMS (altered mental status) [R41.82] Patient Active Problem List   Diagnosis Date Noted   AMS (altered mental status) 02/25/2023   Alcoholism (HCC) 08/12/2019   Anxiety state 08/12/2019   Bulimia nervosa? 08/12/2019   PCP:  Patient, No Pcp Per Pharmacy:   [id number] - [place], [place] - [address] [place] [address] Phone: [phone] Fax: [phone]     Social Determinants of Health (SDOH) Social History: SDOH Screenings   Depression (PHQ2-9): Medium Risk (01/12/2023)  Social Connections: Unknown (11/19/2021)   Received from [facility]  Tobacco Use: Low Risk  (02/09/2023)   SDOH Interventions:     Readmission Risk Interventions     No data to display

## 2023-02-27 NOTE — Consult Note (Addendum)
[facility] Face-to-Face Psychiatry Consult   Reason for Consult:  Alcohol use Referring Physician:  Dr. Chestine Spore Patient Identification: Anthony [name] MRN:  [id number] Principal Diagnosis: AMS (altered mental status) Diagnosis:  Principal Problem:   AMS (altered mental status) Active Problems:   Alcoholism (HCC)   Total Time spent with patient: 1 hour  Subjective:   Anthony [name] is a 57 y.o. male patient admitted with DT's. Patient is seen today after 3 day hospital stay of high acuity that resulted in ICU admission. Wife is present and consent is obtained to conduct evaluation in her presence.   He endorses multiple acute symptoms of mania at this time to include impulsivity, irritability, mood lability, pressured speech.  He endorses mood swings up to 4+ times a day. He also reports an incident of road age when assessing for anger and agitation and irritability. He states he has always had irritability. He reports decreased need for sleep "2-3 hours a night and is able to function the rest of the day. He does not present with any of the above symptoms on this evaluation.During the evaluation he gets easily distracted and forgetful.  He further endorses depressive symptoms to include anhedonia, hopelessness, worthlessness, anhedonia, tearful, withdrawn, isolation, lack of motivation, guilty.  He denies any acute psychosis, paranoia.  He does not appear to be displaying any or responding to internal stimuli, external stimuli, or exhibiting delusional thought disorder.  When assessing for eating disorders, he denies any history but admits to purging after drinking 18 beers "to keep the buzz." When assessing for trauma related symptoms he becomes tearful and silent. Patient is told to refrain from trauma history as this time as he was becoming acutely distressed. Patient denies any access to weapons, denies any nicotine, vaping,  and or substance abuse.  In regards to his alcohol use, he states he has been  drinking since the age of 59. He reports drinking 18 beers a day. He denies any acute alcohol withdrawal symptoms related to alcohol cessation. He reports his longest period of sobriety being 9 months following discharge [facility]. He reports having been to inpatient rehab and detox numerous times. He reports poor sleep and poor appetite. "Diet consists of chips and bread only.   Patient denies any auditory and/or visual hallucinations, does not appear to be responding to internal or external stimuli.  There is no evidence of delusional thought content and patient appears to answer all questions appropriately.     Patient denies having a mental health diagnosis and reports that he has received outpatient counseling or medication management.   Patient states that he currently lives with his wife and 72 year old son. He is an Personnel officer.    On evaluation patient is alert and oriented x 4.  Patient is calm and cooperative, engages well with this psychiatric provider.  Patient's eye contact, speech, mood, all appear to be normal.  Patient minimizes his suicidality as well as his alcohol use disorder. He lacks insight and judgment and unable to understand the negative impact of alcohol on his medical conditions and mental health. He does refute suicidality. He further denies any recent suicidal thoughts, suicidal ideations, and or nonsuicidal self-injurious behavior.  Patient acknowledges poor coping skills and maladaptive behaviors, that he has learned over time and admits that he has to work on these things.   HPI:  Anthony [name] is a 57 year old male, with a history MDD and  alcoholism, admitted overnight seeking alcohol detox. Nursing staff notified this writer that patient has been increasingly medically unstable, confused, disoriented to self and situation, with persistent tremors for last few hours. Patient current CIWA score per nursing is 16, and he has responded  poorly to antipsychotics and ativan., which were last given 1.5 hr ago. He initially presented to the [facility] but was transferred for reasons listed above. Thus a psychiatric consult was placed as he required an admission.   Past Psychiatric History: Per patient only has a history of Alcohol use disorder. He denies any eating disorder, depression or anxiety disorder. He reports only taking thiamine only.   Risk to Self:   Denies Risk to Others:   Denies Prior Inpatient Therapy:   Denies Prior Outpatient Therapy:   Wendie Chess at [facility]  Past Medical History:  Past Medical History:  Diagnosis Date   Alcohol withdrawal syndrome (HCC)    Alcoholism (HCC)    Anxiety    Depression    ED (erectile dysfunction)    GERD (gastroesophageal reflux disease)    Suicidal ideation    Tinnitus     Past Surgical History:  Procedure Laterality Date   LEG SURGERY Right    broken leg   ROTATOR CUFF REPAIR Bilateral    Family History:  Family History  Problem Relation Age of Onset   Diabetes Mother    Family Psychiatric  History: Mother- "she takes klonopin whatever that is used for. Father-GAD and Depression Social History:  Social History   Substance and Sexual Activity  Alcohol Use Yes   Comment: 3 per day     Social History   Substance and Sexual Activity  Drug Use Never    Social History   Socioeconomic History   Marital status: Divorced    Spouse name: Not on file   Number of children: Not on file   Years of education: Not on file   Highest education level: Not on file  Occupational History   Occupation: electrician  Tobacco Use   Smoking status: Never   Smokeless tobacco: Never  Vaping Use   Vaping status: Never Used  Substance and Sexual Activity   Alcohol use: Yes    Comment: 3 per day   Drug use: Never   Sexual activity: Not on file  Other Topics Concern   Not on file  Social History Narrative   Patient is married he has a son born in 2010.   He is an  Personnel officer at [employer]   He has alcoholism and is currently drinking   No tobacco or drug use   Social Determinants of Corporate investment banker Strain: Not on file  Food Insecurity: Not on file  Transportation Needs: Not on file  Physical Activity: Not on file  Stress: Not on file  Social Connections: Unknown (11/19/2021)   Received from [facility]   Social Network    Social Network: Not on file   Additional Social History:    Allergies:  No Known Allergies  Labs:  Results for orders placed or performed during the hospital encounter of 02/25/23 (from the past 48 hour(s))  Urinalysis, Routine w reflex microscopic -Urine, Clean Catch     Status: Abnormal   Collection Time: 02/25/23  2:50 PM  Result Value Ref Range   Color, Urine YELLOW YELLOW   APPearance CLEAR CLEAR   Specific Gravity, Urine 1.011 1.005 - 1.030   pH 7.0 5.0 - 8.0  Glucose, UA NEGATIVE NEGATIVE mg/dL   Hgb urine dipstick MODERATE (A) NEGATIVE   Bilirubin Urine NEGATIVE NEGATIVE   Ketones, ur 20 (A) NEGATIVE mg/dL   Protein, ur NEGATIVE NEGATIVE mg/dL   Nitrite NEGATIVE NEGATIVE   Leukocytes,Ua NEGATIVE NEGATIVE   RBC / HPF >50 0 - 5 RBC/hpf   WBC, UA 0-5 0 - 5 WBC/hpf   Bacteria, UA RARE (A) NONE SEEN   Squamous Epithelial / HPF 0-5 0 - 5 /HPF   Mucus PRESENT     Comment: Performed at [facility], [address], [place], [place] [address]  Rapid urine drug screen (hospital performed)     Status: Abnormal   Collection Time: 02/25/23  2:50 PM  Result Value Ref Range   Opiates NONE DETECTED NONE DETECTED   Cocaine NONE DETECTED NONE DETECTED   Benzodiazepines POSITIVE (A) NONE DETECTED   Amphetamines NONE DETECTED NONE DETECTED   Tetrahydrocannabinol NONE DETECTED NONE DETECTED   Barbiturates NONE DETECTED NONE DETECTED    Comment: (NOTE) DRUG SCREEN FOR MEDICAL PURPOSES ONLY.  IF CONFIRMATION IS NEEDED FOR ANY PURPOSE, NOTIFY LAB WITHIN 5 DAYS.  LOWEST DETECTABLE  LIMITS FOR URINE DRUG SCREEN Drug Class                     Cutoff (ng/mL) Amphetamine and metabolites    1000 Barbiturate and metabolites    200 Benzodiazepine                 200 Opiates and metabolites        300 Cocaine and metabolites        300 THC                            50 Performed at [facility], [address], [place], [place] [address]   Glucose, capillary     Status: Abnormal   Collection Time: 02/25/23  3:18 PM  Result Value Ref Range   Glucose-Capillary 115 (H) 70 - 99 mg/dL    Comment: Glucose reference range applies only to samples taken after fasting for at least 8 hours.  Vitamin B12     Status: None   Collection Time: 02/25/23  3:46 PM  Result Value Ref Range   Vitamin B-12 269 180 - 914 pg/mL    Comment: (NOTE) This assay is not validated for testing neonatal or myeloproliferative syndrome specimens for Vitamin B12 levels. Performed at [facility], [address] [place], [place] [address]   Folate     Status: None   Collection Time: 02/25/23  4:30 PM  Result Value Ref Range   Folate 26.8 >5.9 ng/mL    Comment: RESULT CONFIRMED BY MANUAL DILUTION Performed at [facility], [address], [place], [place] [address]   Glucose, capillary     Status: Abnormal   Collection Time: 02/25/23  7:57 PM  Result Value Ref Range   Glucose-Capillary 122 (H) 70 - 99 mg/dL    Comment: Glucose reference range applies only to samples taken after fasting for at least 8 hours.  Glucose, capillary     Status: None   Collection Time: 02/25/23 11:51 PM  Result Value Ref Range   Glucose-Capillary 98 70 - 99 mg/dL    Comment: Glucose reference range applies only to samples taken after fasting for at least 8 hours.  Glucose, capillary     Status: Abnormal  Collection Time: 02/26/23  3:47 AM  Result Value Ref Range   Glucose-Capillary 102 (H) 70 - 99 mg/dL    Comment: Glucose reference range applies only to samples taken after fasting for at least 8  hours.  Renal function panel     Status: Abnormal   Collection Time: 02/26/23  6:34 AM  Result Value Ref Range   Sodium 138 135 - 145 mmol/L    Comment: DELTA CHECK NOTED   Potassium 4.2 3.5 - 5.1 mmol/L   Chloride 100 98 - 111 mmol/L   CO2 26 22 - 32 mmol/L   Glucose, Bld 106 (H) 70 - 99 mg/dL    Comment: Glucose reference range applies only to samples taken after fasting for at least 8 hours.   BUN 7 6 - 20 mg/dL   Creatinine, Ser 0.34 0.61 - 1.24 mg/dL   Calcium 9.5 8.9 - 74.2 mg/dL   Phosphorus 3.1 2.5 - 4.6 mg/dL   Albumin 3.5 3.5 - 5.0 g/dL   GFR, Estimated >59 >56 mL/min    Comment: (NOTE) Calculated using the CKD-EPI Creatinine Equation (2021)    Anion gap 12 5 - 15    Comment: Performed at [facility], [address], [place], [place] [address]  CBC     Status: Abnormal   Collection Time: 02/26/23  6:34 AM  Result Value Ref Range   WBC 11.4 (H) 4.0 - 10.5 K/uL   RBC 4.56 4.22 - 5.81 MIL/uL   Hemoglobin 13.7 13.0 - 17.0 g/dL   HCT 43.3 29.5 - 18.8 %   MCV 87.9 80.0 - 100.0 fL   MCH 30.0 26.0 - 34.0 pg   MCHC 34.2 30.0 - 36.0 g/dL   RDW 41.6 60.6 - 30.1 %   Platelets 184 150 - 400 K/uL   nRBC 0.0 0.0 - 0.2 %    Comment: Performed at [facility], [address], [place], [place] [address]  Glucose, capillary     Status: Abnormal   Collection Time: 02/26/23  7:36 AM  Result Value Ref Range   Glucose-Capillary 116 (H) 70 - 99 mg/dL    Comment: Glucose reference range applies only to samples taken after fasting for at least 8 hours.  Glucose, capillary     Status: Abnormal   Collection Time: 02/26/23 11:18 AM  Result Value Ref Range   Glucose-Capillary 100 (H) 70 - 99 mg/dL    Comment: Glucose reference range applies only to samples taken after fasting for at least 8 hours.  Glucose, capillary     Status: Abnormal   Collection Time: 02/26/23  3:41 PM  Result Value Ref Range   Glucose-Capillary 118 (H) 70 - 99 mg/dL    Comment: Glucose reference range  applies only to samples taken after fasting for at least 8 hours.  Glucose, capillary     Status: Abnormal   Collection Time: 02/26/23  7:50 PM  Result Value Ref Range   Glucose-Capillary 101 (H) 70 - 99 mg/dL    Comment: Glucose reference range applies only to samples taken after fasting for at least 8 hours.  Glucose, capillary     Status: None   Collection Time: 02/26/23 11:30 PM  Result Value Ref Range   Glucose-Capillary 90 70 - 99 mg/dL    Comment: Glucose reference range applies only to samples taken after fasting for at least 8 hours.  Glucose, capillary     Status: None   Collection Time: 02/27/23  3:29  AM  Result Value Ref Range   Glucose-Capillary 85 70 - 99 mg/dL    Comment: Glucose reference range applies only to samples taken after fasting for at least 8 hours.  Renal function panel     Status: Abnormal   Collection Time: 02/27/23  4:25 AM  Result Value Ref Range   Sodium 138 135 - 145 mmol/L   Potassium 3.5 3.5 - 5.1 mmol/L   Chloride 98 98 - 111 mmol/L   CO2 24 22 - 32 mmol/L   Glucose, Bld 96 70 - 99 mg/dL    Comment: Glucose reference range applies only to samples taken after fasting for at least 8 hours.   BUN 7 6 - 20 mg/dL   Creatinine, Ser 1.61 0.61 - 1.24 mg/dL   Calcium 9.4 8.9 - 09.6 mg/dL   Phosphorus 3.2 2.5 - 4.6 mg/dL   Albumin 3.4 (L) 3.5 - 5.0 g/dL   GFR, Estimated >04 >54 mL/min    Comment: (NOTE) Calculated using the CKD-EPI Creatinine Equation (2021)    Anion gap 16 (H) 5 - 15    Comment: Performed at [facility], [address] [place], [place] [address]  Glucose, capillary     Status: Abnormal   Collection Time: 02/27/23  7:27 AM  Result Value Ref Range   Glucose-Capillary 101 (H) 70 - 99 mg/dL    Comment: Glucose reference range applies only to samples taken after fasting for at least 8 hours.  Glucose, capillary     Status: None   Collection Time: 02/27/23 11:17 AM  Result Value Ref Range   Glucose-Capillary 83 70 - 99 mg/dL     Comment: Glucose reference range applies only to samples taken after fasting for at least 8 hours.    Current Facility-Administered Medications  Medication Dose Route Frequency Provider Last Rate Last Admin   bethanechol (URECHOLINE) tablet 10 mg  10 mg Oral TID Olalere, Adewale A, MD       Chlorhexidine Gluconate Cloth 2 % PADS 6 each  6 each Topical Daily Lorin Glass, MD   6 each at 02/27/23 [phone]   docusate sodium (COLACE) capsule 100 mg  100 mg Oral BID PRN Minor, Vilinda Blanks, NP       enoxaparin (LOVENOX) injection 40 mg  40 mg Subcutaneous Q24H Karie Fetch P, DO   40 mg at 02/27/23 8295   folic acid injection 1 mg  1 mg Intravenous Daily Karie Fetch P, DO   1 mg at 02/27/23 6213   LORazepam (ATIVAN) injection 0.5-2 mg  0.5-2 mg Intravenous Q4H PRN Migdalia Dk, MD   2 mg at 02/26/23 1816   multivitamin with minerals tablet 1 tablet  1 tablet Oral Daily Achille Rich, PA-C       Oral care mouth rinse  15 mL Mouth Rinse 4 times per day Karie Fetch P, DO   15 mL at 02/27/23 0865   Oral care mouth rinse  15 mL Mouth Rinse PRN Karie Fetch P, DO       PHENobarbital (LUMINAL) tablet 97.2 mg  97.2 mg Oral Q8H Marrianne Mood, MD       Followed by   [name] ON 03/01/2023] PHENobarbital (LUMINAL) tablet 64.8 mg  64.8 mg Oral Q8H Marrianne Mood, MD       Followed by   [name] ON 03/03/2023] PHENobarbital (LUMINAL) tablet 32.4 mg  32.4 mg Oral Q8H Marrianne Mood, MD       polyethylene glycol [facility] /  GLYCOLAX) packet 17 g  17 g Oral Daily PRN Minor, Vilinda Blanks, NP       potassium chloride 10 mEq in 100 mL IVPB  10 mEq Intravenous Q1 Hr x 5 Olalere, Adewale A, MD       thiamine (VITAMIN B1) 250 mg in sodium chloride 0.9 % 50 mL IVPB  250 mg Intravenous Daily Knute Neu, [facility]       [START ON 03/04/2023] thiamine (VITAMIN B1) tablet 100 mg  100 mg Oral Daily Knute Neu, [facility]       Or   [START ON 03/04/2023] thiamine (VITAMIN B1) injection 100 mg  100 mg Intravenous Daily Knute Neu, [facility]        Musculoskeletal: Strength & Muscle Tone: within normal limits Gait & Station: normal Patient leans: N/A            Psychiatric Specialty Exam:  Presentation  General Appearance:  Appropriate for Environment; Casual  Eye Contact: Fair  Speech: Clear and Coherent; Normal Rate  Speech Volume: Normal  Handedness: Right   Mood and Affect  Mood: Anxious; Irritable  Affect: Congruent; Tearful   Thought Process  Thought Processes: Coherent; Linear  Descriptions of Associations:Tangential  Orientation:Full (Time, Place and Person)  Thought Content:Logical  History of Schizophrenia/Schizoaffective disorder:No  Duration of Psychotic Symptoms:No data recorded Hallucinations:Hallucinations: None  Ideas of Reference:None  Suicidal Thoughts:Suicidal Thoughts: No  Homicidal Thoughts:Homicidal Thoughts: No   Sensorium  Memory: Immediate Fair; Recent Good; Remote Fair  Judgment: Poor  Insight: Poor   Executive Functions  Concentration: Fair  Attention Span: Poor  Recall: Poor  Fund of Knowledge: Fair  Language: Fair   Psychomotor Activity  Psychomotor Activity: Psychomotor Activity: Normal; Tremor   Assets  Assets: Communication Skills; Desire for Improvement; Financial Resources/Insurance; Social Support; Resilience; Housing   Sleep  Sleep: Sleep: Poor   Physical Exam: Physical Exam Vitals and nursing note reviewed.  Constitutional:      Appearance: Normal appearance. He is normal weight.  Neurological:     General: No focal deficit present.     Mental Status: He is alert and oriented to person, place, and time. Mental status is at baseline.  Psychiatric:        Attention and Perception: Perception normal. He is inattentive.        Mood and Affect: Mood is anxious. Affect is labile.        Speech: Speech normal.        Behavior: Behavior is cooperative.        Thought Content: Thought content  normal.        Cognition and Memory: Cognition and memory normal.        Judgment: Judgment is impulsive.    Review of Systems  Neurological:  Positive for tremors.  Psychiatric/Behavioral:  Positive for depression and substance abuse (etoh). The patient is nervous/anxious and has insomnia.   All other systems reviewed and are negative.  Blood pressure (!) 165/105, pulse 90, temperature 98.3 F (36.8 C), temperature source Axillary, resp. rate 18, weight 68.6 kg, SpO2 97%. Body mass index is 22.33 kg/m.  Treatment Plan Summary: Daily contact with patient to assess and evaluate symptoms and progress in treatment, Medication management, and Plan    -Continue IV thiamine 250 daily. -Continue Phenobarb taper -TOC referral for inpatient psych once medically stable. - Recommend continue ongoing efforts for long term inpatient rehab once medically stable.  -At this time patient is willing to go voluntary, in  the event he attempts to leave will need to be placed under IVC. He is a moderate to high risk suicide attempt, with limited non-modifiable risk factors.  -Once stable will discuss medication for management of antidepressant or antipsychotic. Patient historically is noncompliant, with no insight on alcohol use.   Thiamine is pending  Psychiatry will continue to monitor.  Disposition: Recommend psychiatric Inpatient admission when medically cleared.  Maryagnes Amos, FNP 02/27/2023 2:25 PM

## 2023-02-27 NOTE — Plan of Care (Signed)
  Problem: Education: Goal: Knowledge of General Education information will improve Description: Including pain rating scale, medication(s)/side effects and non-pharmacologic comfort measures Outcome: Progressing   Problem: Clinical Measurements: Goal: Ability to maintain clinical measurements within normal limits will improve Outcome: Progressing Goal: Diagnostic test results will improve Outcome: Progressing Goal: Cardiovascular complication will be avoided Outcome: Progressing   Problem: Coping: Goal: Level of anxiety will decrease Outcome: Progressing   Problem: Elimination: Goal: Will not experience complications related to urinary retention Outcome: Progressing   Problem: Pain Managment: Goal: General experience of comfort will improve Outcome: Progressing   Problem: Safety: Goal: Ability to remain free from injury will improve Outcome: Progressing   Problem: Skin Integrity: Goal: Risk for impaired skin integrity will decrease Outcome: Progressing   Problem: Safety: Goal: Non-violent Restraint(s) Outcome: Progressing

## 2023-02-28 DIAGNOSIS — F1020 Alcohol dependence, uncomplicated: Secondary | ICD-10-CM | POA: Diagnosis not present

## 2023-02-28 DIAGNOSIS — F1994 Other psychoactive substance use, unspecified with psychoactive substance-induced mood disorder: Secondary | ICD-10-CM | POA: Diagnosis not present

## 2023-02-28 LAB — RENAL FUNCTION PANEL
Albumin: 3 g/dL — ABNORMAL LOW (ref 3.5–5.0)
Anion gap: 13 (ref 5–15)
BUN: 7 mg/dL (ref 6–20)
CO2: 23 mmol/L (ref 22–32)
Calcium: 8.9 mg/dL (ref 8.9–10.3)
Chloride: 95 mmol/L — ABNORMAL LOW (ref 98–111)
Creatinine, Ser: 0.72 mg/dL (ref 0.61–1.24)
GFR, Estimated: >60 mL/min (ref >60)
Glucose, Bld: 80 mg/dL (ref 70–99)
Phosphorus: 3.5 mg/dL (ref 2.5–4.6)
Potassium: 2.9 mmol/L — ABNORMAL LOW (ref 3.5–5.1)
Sodium: 131 mmol/L — ABNORMAL LOW (ref 135–145)

## 2023-02-28 MED ORDER — FOLIC ACID 1 MG PO TABS
1.0000 mg | ORAL_TABLET | Freq: Every day | ORAL | Status: DC
  Administered 2023-03-01 – 2023-03-04 (×4): 1 mg via ORAL
  Filled 2023-02-28 (×4): qty 1

## 2023-02-28 MED ORDER — FOLIC ACID 5 MG/ML IJ SOLN
1.0000 mg | Freq: Every day | INTRAMUSCULAR | Status: AC
  Administered 2023-02-28: 1 mg via INTRAVENOUS
  Filled 2023-02-28: qty 0.2

## 2023-02-28 MED ORDER — POTASSIUM CHLORIDE 10 MEQ/100ML IV SOLN
10.0000 meq | INTRAVENOUS | Status: AC
  Administered 2023-02-28 (×4): 10 meq via INTRAVENOUS
  Filled 2023-02-28 (×4): qty 100

## 2023-02-28 MED ORDER — SODIUM CHLORIDE 0.9 % IV SOLN
INTRAVENOUS | Status: DC | PRN
  Administered 2023-02-28: 250 mL via INTRAVENOUS

## 2023-02-28 MED ORDER — POTASSIUM CHLORIDE CRYS ER 20 MEQ PO TBCR
40.0000 meq | EXTENDED_RELEASE_TABLET | Freq: Once | ORAL | Status: AC
  Administered 2023-02-28: 40 meq via ORAL
  Filled 2023-02-28: qty 2

## 2023-02-28 NOTE — Evaluation (Signed)
Occupational Therapy Evaluation Patient Details Name: Anthony [name] MRN: [id number] DOB: [birth year] Today's Date: 02/28/2023   History of Present Illness Pt is a 57 y/o M presenting to ED on 8/21 with worsening alcohol withdrawal, altered, having hallucinations, admitted for AMS. PMH includes alcohol use, MDD, anxiety, depression, GERD   Clinical Impression   Pt reports ind at baseline with ADLs/functional mobility, lives with family. Pt currently reports mild balance deficits when mobilizing, needs set up - CGA for ADLS, mod I for bed mobility and CGA for transfers without AD. Pt overall with flat affect, follows commands consistently with increased time. Pt presenting with impairments listed below, will follow acutely. Anticipate no OT follow up needs at d/c.        If plan is discharge home, recommend the following: Direct supervision/assist for medications management;Direct supervision/assist for financial management;Assistance with cooking/housework    Functional Status Assessment  Patient has had a recent decline in their functional status and demonstrates the ability to make significant improvements in function in a reasonable and predictable amount of time.  Equipment Recommendations  None recommended by OT    Recommendations for Other Services PT consult     Precautions / Restrictions Restrictions Weight Bearing Restrictions: No      Mobility Bed Mobility Overal bed mobility: Modified Independent                  Transfers Overall transfer level: Needs assistance Equipment used: None Transfers: Sit to/from Stand Sit to Stand: Contact guard assist                  Balance Overall balance assessment: Mild deficits observed, not formally tested                                         ADL either performed or assessed with clinical judgement   ADL Overall ADL's : Needs assistance/impaired Eating/Feeding: Set up;Sitting   Grooming:  Set up;Sitting   Upper Body Bathing: Set up;Sitting   Lower Body Bathing: Set up;Sitting/lateral leans   Upper Body Dressing : Supervision/safety   Lower Body Dressing: Supervision/safety   Toilet Transfer: Contact guard assist;Ambulation           Functional mobility during ADLs: Contact guard assist       Vision   Vision Assessment?: No apparent visual deficits     Perception Perception: Not tested       Praxis Praxis: Not tested       Pertinent Vitals/Pain Pain Assessment Pain Assessment: No/denies pain     Extremity/Trunk Assessment Upper Extremity Assessment Upper Extremity Assessment: Overall WFL for tasks assessed   Lower Extremity Assessment Lower Extremity Assessment: Defer to PT evaluation   Cervical / Trunk Assessment Cervical / Trunk Assessment: Normal   Communication Communication Communication: No apparent difficulties   Cognition Arousal: Alert Behavior During Therapy: Flat affect Overall Cognitive Status: Within Functional Limits for tasks assessed                                 General Comments: follows commands apporpriately, flat affect overall, increased time for responses     General Comments  VSS on RA    Exercises     Shoulder Instructions      Home Living Family/patient expects to be discharged to:: Other (Comment) (Rehab residential  facility (30-day))                                 Additional Comments: was at home with family, no steps to enter, single level home, walk in shower      Prior Functioning/Environment Prior Level of Function : Independent/Modified Independent             Mobility Comments: no AD use ADLs Comments: ind        OT Problem List: Decreased activity tolerance;Impaired balance (sitting and/or standing);Decreased cognition      OT Treatment/Interventions: Self-care/ADL training;Therapeutic exercise;Energy conservation;DME and/or AE instruction;Cognitive  remediation/compensation;Balance training;Patient/family education    OT Goals(Current goals can be found in the care plan section) Acute Rehab OT Goals Patient Stated Goal: none stated OT Goal Formulation: With patient Time For Goal Achievement: 03/14/23 Potential to Achieve Goals: Good ADL Goals Additional ADL Goal #1: pt will follow 4 step instruction with min cues in prep for ADLs Additional ADL Goal #2: pt will maintain good standing balance during functional tasks in prep for ADLs  OT Frequency: Min 1X/week    Co-evaluation              AM-PAC OT "6 Clicks" Daily Activity     Outcome Measure Help from another person eating meals?: None Help from another person taking care of personal grooming?: None Help from another person toileting, which includes using toliet, bedpan, or urinal?: A Little Help from another person bathing (including washing, rinsing, drying)?: A Little Help from another person to put on and taking off regular upper body clothing?: A Little Help from another person to put on and taking off regular lower body clothing?: A Little 6 Click Score: 20   End of Session Nurse Communication: Mobility status  Activity Tolerance: Patient tolerated treatment well Patient left: in bed;with call bell/phone within reach;with bed alarm set;with family/visitor present  OT Visit Diagnosis: Unsteadiness on feet (R26.81);Other abnormalities of gait and mobility (R26.89);Muscle weakness (generalized) (M62.81)                Time: 1610-9604 OT Time Calculation (min): 15 min Charges:  OT General Charges $OT Visit: 1 Visit OT Evaluation $OT Eval Low Complexity: 1 Low  Anthony [name] K, OTD, OTR/L [facility] [phone]   Anthony [name] 02/28/2023, 1:05 PM

## 2023-02-28 NOTE — Progress Notes (Signed)
NAME:  Anthony [name], MRN:  [id number], DOB:  [birth year], LOS: 3 ADMISSION DATE:  02/25/2023, CONSULTATION DATE: 02/25/2023 REFERRING MD: EDP, CHIEF COMPLAINT: Altered mental status secondary to alcohol withdrawal  History of Present Illness:  57 year old with long history of EtOH abuse coupled with anxiety, depression, suicidal ideation and major depression.  Originally presents to behavioral health center for alcohol withdrawal but due to tachycardia and hallucinations was transferred to [facility] emergency department.  He been heavily sedated with Ativan.  The plan is to start him on a Precedex drip and admit him to the intensive care unit.  Pertinent  Medical History   Past Medical History:  Diagnosis Date   Alcohol withdrawal syndrome (HCC)    Alcoholism (HCC)    Anxiety    Depression    ED (erectile dysfunction)    GERD (gastroesophageal reflux disease)    Suicidal ideation    Tinnitus    Significant Hospital Events: Including procedures, antibiotic start and stop dates in addition to other pertinent events   8/21 admitted to ICU, started on phenobarbital and precedex  Interim History / Subjective:  No overnight events Not on Precedex Phenobarb taper Awake and interactive, denies any pain or discomfort at present  Objective   Blood pressure 136/89, pulse 85, temperature 98.8 F (37.1 C), temperature source Oral, resp. rate 16, weight 70.7 kg, SpO2 95%.        Intake/Output Summary (Last 24 hours) at 02/28/2023 7829 Last data filed at 02/28/2023 0900 Gross per 24 hour  Intake 1864.68 ml  Output 2600 ml  Net -735.32 ml   Filed Weights   02/25/23 1249 02/27/23 0500 02/28/23 0500  Weight: 70.8 kg 68.6 kg 70.7 kg    Examination: General: Middle-age, does not appear to be in distress, HENT: Moist oral mucosa Lungs: Clear breath sounds, Cardiovascular: S1-S2 appreciated with no murmur Abdomen: Bowel sounds appreciated Extremities: moving all extremities Neuro:  Awake alert interactive GU: Foley in place  I reviewed nursing notes, Consultant notes, last 24 h vitals and pain scores, last 48 h intake and output, last 24 h labs and trends, and last 24 h imaging results.  Right upper quadrant ultrasound-steatosis, no cholelithiasis  Chest x-ray with no infiltrate-reviewed by myself CT head personally reviewed-no acute abnormalities  Resolved Hospital Problem list     Assessment & Plan:   EtOH withdrawal Metabolic encephalopathy -On phenobarbital taper, was able to wean off Precedex -Continue multivitamins, thiamine  History of depression -Appreciate psych involvement -For inpatient psych once medically stable, psych to continue to assist  -Initiation and management of antidepressant/antipsychotic  Hypertension -Was on losartan  Hematuria-secondary traumatic Foley insertion -Will continue to monitor -I believe Foley can be removed today -Continue Urecholine  Hyponatremia, hypokalemia -Potassium being repleted  Will transfer to medical floor today   Best Practice (right click and "Reselect all SmartList Selections" daily)   Diet/type: Regular diet, continue to advance as tolerated DVT prophylaxis: LMWH GI prophylaxis: N/A Lines: N/A Foley:  N/A Code Status:  full code Last date of multidisciplinary goals of care discussion [wife updated at bedside 8/22]  Labs   CBC: Recent Labs  Lab 02/25/23 0852 02/26/23 0634  WBC 5.6 11.4*  NEUTROABS 4.1  --   HGB 12.5* 13.7  HCT 36.0* 40.1  MCV 84.9 87.9  PLT 230 184    Basic Metabolic Panel: Recent Labs  Lab 02/25/23 0852 02/26/23 0634 02/27/23 0425 02/28/23 0453  NA 126* 138 138 131*  K 3.9 4.2 3.5  2.9*  CL 87* 100 98 95*  CO2 24 26 24 23   GLUCOSE 103* 106* 96 80  BUN 6 7 7 7   CREATININE 0.83 0.82 0.95 0.72  CALCIUM 8.5* 9.5 9.4 8.9  PHOS  --  3.1 3.2 3.5   GFR: Estimated Creatinine Clearance: 101.9 mL/min (by C-G formula based on SCr of 0.72 mg/dL). Recent  Labs  Lab 02/25/23 0852 02/26/23 0634  WBC 5.6 11.4*    Liver Function Tests: Recent Labs  Lab 02/25/23 0852 02/26/23 0634 02/27/23 0425 02/28/23 0453  AST 67*  --   --   --   ALT 31  --   --   --   ALKPHOS 56  --   --   --   BILITOT 2.3*  --   --   --   PROT 6.6  --   --   --   ALBUMIN 3.7 3.5 3.4* 3.0*   Virl Diamond, MD [facility] Pager: See Loretha Stapler

## 2023-02-28 NOTE — Plan of Care (Signed)

## 2023-02-28 NOTE — Progress Notes (Signed)
[facility] ADULT ICU REPLACEMENT PROTOCOL   The patient does apply for the [facility] Adult ICU Electrolyte Replacment Protocol based on the criteria listed below:   1.Exclusion criteria: TCTS, ECMO, Dialysis, and Myasthenia Gravis patients 2. Is GFR >/= 30 ml/min? Yes.    Patient's GFR today is >60 3. Is SCr </= 2? Yes.   Patient's SCr is 0.72 mg/dL 4. Did SCr increase >/= 0.5 in 24 hours? No. 5.Pt's weight >40kg  Yes.   6. Abnormal electrolyte(s): Potassium 2.9  7. Electrolytes replaced per protocol 8.  Call MD STAT for K+ </= 2.5, Phos </= 1, or Mag </= 1 Physician:  Dr. Namon Cirri A Grayland Daisey 02/28/2023 5:54 AM

## 2023-02-28 NOTE — Evaluation (Signed)
Physical Therapy Evaluation Patient Details Name: Anthony [name] MRN: [id number] DOB: 16-Mar-1966 Today's Date: 02/28/2023  History of Present Illness  Pt is a 57 y/o M presenting to ED on 8/21 with worsening alcohol withdrawal, altered, having hallucinations, admitted for AMS. PMH includes alcohol use, MDD, anxiety, depression, GERD.  Clinical Impression  Pt admitted with above diagnosis. Independent PTA. Improving rapidly per notes and pt subjective feedback. Eager to mobilize. Requires supervision for safety with gait, with and without assistive device. Anticipate further functional improvement prior to d/c. Unlikely to need ambulatory device or follow-up PT after d/c. Will follow and progress during admission. DGI = low fall risk. BERG = moderate fall risk. Pt currently with functional limitations due to the deficits listed below (see PT Problem List). Pt will benefit from acute skilled PT to increase their independence and safety with mobility to allow discharge.           If plan is discharge home, recommend the following: A little help with walking and/or transfers;A little help with bathing/dressing/bathroom;Assistance with cooking/housework;Direct supervision/assist for financial management;Direct supervision/assist for medications management;Assist for transportation   Can travel by private vehicle        Equipment Recommendations None recommended by PT  Recommendations for Other Services       Functional Status Assessment Patient has had a recent decline in their functional status and demonstrates the ability to make significant improvements in function in a reasonable and predictable amount of time.     Precautions / Restrictions Precautions Precautions: Fall Restrictions Weight Bearing Restrictions: No      Mobility  Bed Mobility Overal bed mobility: Modified Independent                  Transfers Overall transfer level: Needs assistance Equipment used:  None Transfers: Sit to/from Stand Sit to Stand: Contact guard assist           General transfer comment: CGA for safety,showing some posterior lean bracing back of legs on bed, did not require UE to press up.    Ambulation/Gait Ambulation/Gait assistance: Supervision Gait Distance (Feet): 160 Feet Assistive device: Rolling walker (2 wheels), None Gait Pattern/deviations: Step-through pattern, Decreased stride length, Ataxic, Drifts right/left Gait velocity: decr Gait velocity interpretation: 1.31 - 2.62 ft/sec, indicative of limited community ambulator   General Gait Details: Minor drift. Pt quite guarded and rigid with steps however no overt balance noted. Occasionally lifting RW with drift. Half distance with RW, half distance without. Supervision for safety. Tolerated dynamic challenges as well. Cues for safety, awareness, and proximity to obstacles.  Stairs            Wheelchair Mobility     Tilt Bed    Modified Rankin (Stroke Patients Only)       Balance Overall balance assessment: Needs assistance Sitting-balance support: Feet supported, No upper extremity supported Sitting balance-Leahy Scale: Normal     Standing balance support: No upper extremity supported, During functional activity Standing balance-Leahy Scale: Fair                   Standardized Balance Assessment Standardized Balance Assessment : Berg Balance Test, Dynamic Gait Index Berg Balance Test Sit to Stand: Able to stand without using hands and stabilize independently Standing Unsupported: Able to stand safely 2 minutes Sitting with Back Unsupported but Feet Supported on Floor or Stool: Able to sit safely and securely 2 minutes Stand to Sit: Sits safely with minimal use of hands Transfers: Able to transfer  safely, minor use of hands Standing Unsupported with Eyes Closed: Able to stand 10 seconds safely Standing Ubsupported with Feet Together: Able to place feet together  independently and stand 1 minute safely From Standing, Reach Forward with Outstretched Arm: Can reach confidently >25 cm (10") From Standing Position, Pick up Object from Floor: Able to pick up shoe, needs supervision From Standing Position, Turn to Look Behind Over each Shoulder: Looks behind from both sides and weight shifts well Turn 360 Degrees: Able to turn 360 degrees safely in 4 seconds or less Standing Unsupported, Alternately Place Feet on Step/Stool: Able to stand independently and complete 8 steps >20 seconds Standing Unsupported, One Foot in Front: Able to take small step independently and hold 30 seconds Standing on One Leg: Unable to try or needs assist to prevent fall Total Score: 48 Dynamic Gait Index Level Surface: Normal Change in Gait Speed: Normal Gait with Horizontal Head Turns: Mild Impairment Gait with Vertical Head Turns: Normal Gait and Pivot Turn: Normal Step Over Obstacle: Normal Step Around Obstacles: Normal Steps: Mild Impairment Total Score: 22       Pertinent Vitals/Pain Pain Assessment Pain Assessment: No/denies pain    Home Living Family/patient expects to be discharged to:: Other (Comment) (Substance abuse rehab residential facility (30-day))                   Additional Comments: was at home with family, no steps to enter, single level home, walk in shower    Prior Function Prior Level of Function : Independent/Modified Independent             Mobility Comments: no AD use ADLs Comments: ind     Extremity/Trunk Assessment   Upper Extremity Assessment Upper Extremity Assessment: Defer to OT evaluation    Lower Extremity Assessment Lower Extremity Assessment: Overall WFL for tasks assessed    Cervical / Trunk Assessment Cervical / Trunk Assessment: Normal  Communication   Communication Communication: No apparent difficulties Cueing Techniques: Verbal cues;Gestural cues  Cognition Arousal: Alert Behavior During Therapy:  Flat affect Overall Cognitive Status: Within Functional Limits for tasks assessed                                 General Comments: follows commands apporpriately, flat affect overall, increased time for responses        General Comments General comments (skin integrity, edema, etc.): VSS on RA    Exercises     Assessment/Plan    PT Assessment Patient needs continued PT services  PT Problem List Decreased activity tolerance;Decreased balance;Decreased mobility;Decreased coordination;Decreased safety awareness;Decreased knowledge of use of DME;Decreased knowledge of precautions       PT Treatment Interventions      PT Goals (Current goals can be found in the Care Plan section)  Acute Rehab PT Goals Patient Stated Goal: full recovery. PT Goal Formulation: With patient/family Time For Goal Achievement: 03/13/23 Potential to Achieve Goals: Good    Frequency Min 1X/week     Co-evaluation               AM-PAC PT "6 Clicks" Mobility  Outcome Measure Help needed turning from your back to your side while in a flat bed without using bedrails?: None Help needed moving from lying on your back to sitting on the side of a flat bed without using bedrails?: None Help needed moving to and from a bed to a chair (including a  wheelchair)?: A Little Help needed standing up from a chair using your arms (e.g., wheelchair or bedside chair)?: A Little Help needed to walk in hospital room?: A Little Help needed climbing 3-5 steps with a railing? : A Little 6 Click Score: 20    End of Session   Activity Tolerance: Patient tolerated treatment well Patient left: in bed;with call bell/phone within reach;with bed alarm set;with family/visitor present   PT Visit Diagnosis: Unsteadiness on feet (R26.81);Other abnormalities of gait and mobility (R26.89);Other symptoms and signs involving the nervous system (R29.898);Ataxic gait (R26.0)    Time: 1250-1304 PT Time Calculation  (min) (ACUTE ONLY): 14 min   Charges:   PT Evaluation $PT Eval Low Complexity: 1 Low   PT General Charges $$ ACUTE PT VISIT: 1 Visit         Kathlyn Sacramento, PT, DPT [facility]  Rehabilitation Services Physical Therapist Office: [phone] Website: [email or web address]   Berton Mount 02/28/2023, 2:05 PM

## 2023-03-01 DIAGNOSIS — R410 Disorientation, unspecified: Secondary | ICD-10-CM

## 2023-03-01 DIAGNOSIS — R9431 Abnormal electrocardiogram [ECG] [EKG]: Secondary | ICD-10-CM | POA: Insufficient documentation

## 2023-03-01 DIAGNOSIS — E876 Hypokalemia: Secondary | ICD-10-CM | POA: Diagnosis not present

## 2023-03-01 DIAGNOSIS — F10931 Alcohol use, unspecified with withdrawal delirium: Secondary | ICD-10-CM | POA: Diagnosis not present

## 2023-03-01 LAB — RENAL FUNCTION PANEL
Albumin: 2.9 g/dL — ABNORMAL LOW (ref 3.5–5.0)
Anion gap: 14 (ref 5–15)
BUN: 8 mg/dL (ref 6–20)
CO2: 23 mmol/L (ref 22–32)
Calcium: 9 mg/dL (ref 8.9–10.3)
Chloride: 99 mmol/L (ref 98–111)
Creatinine, Ser: 0.75 mg/dL (ref 0.61–1.24)
GFR, Estimated: >60 mL/min (ref >60)
Glucose, Bld: 115 mg/dL — ABNORMAL HIGH (ref 70–99)
Phosphorus: 3.7 mg/dL (ref 2.5–4.6)
Potassium: 3.2 mmol/L — ABNORMAL LOW (ref 3.5–5.1)
Sodium: 136 mmol/L (ref 135–145)

## 2023-03-01 LAB — MAGNESIUM: Magnesium: 1.9 mg/dL (ref 1.7–2.4)

## 2023-03-01 MED ORDER — MIDAZOLAM HCL 2 MG/2ML IJ SOLN
2.5000 mg | Freq: Once | INTRAMUSCULAR | Status: AC | PRN
  Administered 2023-03-01: 2.5 mg via INTRAVENOUS
  Filled 2023-03-01: qty 4

## 2023-03-01 MED ORDER — POTASSIUM CHLORIDE CRYS ER 20 MEQ PO TBCR
40.0000 meq | EXTENDED_RELEASE_TABLET | Freq: Once | ORAL | Status: AC
  Administered 2023-03-01: 40 meq via ORAL
  Filled 2023-03-01: qty 2

## 2023-03-01 MED ORDER — NALTREXONE HCL 50 MG PO TABS
50.0000 mg | ORAL_TABLET | Freq: Every day | ORAL | Status: DC
  Administered 2023-03-01 – 2023-03-04 (×4): 50 mg via ORAL
  Filled 2023-03-01 (×5): qty 1

## 2023-03-01 NOTE — Plan of Care (Signed)

## 2023-03-01 NOTE — Progress Notes (Signed)
PROGRESS NOTE    Anthony [name]  WUJ:[id number] DOB: [birth year] DOA: 02/25/2023 PCP: Patient, No Pcp Per    Brief Narrative:  57 year old with long history of EtOH abuse coupled with anxiety, depression, suicidal ideation and major depression. Originally presents to behavioral health center for alcohol withdrawal but due to tachycardia and hallucinations was transferred to [facility] emergency department. He been heavily sedated with Ativan. The plan is to start him on a Precedex drip and admit him to the intensive care unit.    Assessment and Plan: EtOH withdrawal Metabolic encephalopathy- resolved -On phenobarbital taper - weaned off Precedex -Continue multivitamins, thiamine   History of depression -Appreciate psych involvement -For inpatient psych once medically stable, psych to continue to assist - TOC consulted   Hypertension -Was on losartan   Hematuria-secondary traumatic Foley insertion Foley removed   Hyponatremia, hypokalemia -replace K   DVT prophylaxis: enoxaparin (LOVENOX) injection 40 mg Start: 02/26/23 1015 SCDs Start: 02/25/23 0941    Code Status: Full Code Family Communication: called wife  Disposition Plan:  Level of care: Med-Surg Status is: Inpatient Remains inpatient appropriate    Consultants:  Psych PCCM   Subjective: Would like to d/c tomorrow  Objective: Vitals:   02/28/23 1743 02/28/23 2036 03/01/23 0500 03/01/23 0819  BP: (!) 139/99 (!) 152/104  126/84  Pulse: 81 89  76  Resp: 18 18  17   Temp: 98.2 F (36.8 C) 98.4 F (36.9 C)  97.8 F (36.6 C)  TempSrc: Oral Oral  Oral  SpO2: 98% 100%  98%  Weight:   70.2 kg     Intake/Output Summary (Last 24 hours) at 03/01/2023 1045 Last data filed at 02/28/2023 1300 Gross per 24 hour  Intake 284.21 ml  Output 400 ml  Net -115.79 ml   Filed Weights   02/27/23 0500 02/28/23 0500 03/01/23 0500  Weight: 68.6 kg 70.7 kg 70.2 kg    Examination:   General: Appearance:    Well  developed, well nourished male in no acute distress     Lungs:     respirations unlabored  Heart:    Normal heart rate. Normal rhythm. No murmurs, rubs, or gallops.    MS:   All extremities are intact.    Neurologic:   Awake, alert, oriented x 3. No apparent focal neurological           defect.        Data Reviewed: I have personally reviewed following labs and imaging studies  CBC: Recent Labs  Lab 02/25/23 0852 02/26/23 0634  WBC 5.6 11.4*  NEUTROABS 4.1  --   HGB 12.5* 13.7  HCT 36.0* 40.1  MCV 84.9 87.9  PLT 230 184   Basic Metabolic Panel: Recent Labs  Lab 02/25/23 0852 02/26/23 0634 02/27/23 0425 02/28/23 0453 03/01/23 0552  NA 126* 138 138 131* 136  K 3.9 4.2 3.5 2.9* 3.2*  CL 87* 100 98 95* 99  CO2 24 26 24 23 23   GLUCOSE 103* 106* 96 80 115*  BUN 6 7 7 7 8   CREATININE 0.83 0.82 0.95 0.72 0.75  CALCIUM 8.5* 9.5 9.4 8.9 9.0  MG  --   --   --   --  1.9  PHOS  --  3.1 3.2 3.5 3.7   GFR: Estimated Creatinine Clearance: 101.2 mL/min (by C-G formula based on SCr of 0.75 mg/dL). Liver Function Tests: Recent Labs  Lab 02/25/23 9562 02/26/23 1308 02/27/23 0425 02/28/23 0453 03/01/23 0552  AST 67*  --   --   --   --  ALT 31  --   --   --   --   ALKPHOS 56  --   --   --   --   BILITOT 2.3*  --   --   --   --   PROT 6.6  --   --   --   --   ALBUMIN 3.7 3.5 3.4* 3.0* 2.9*   No results for input(s): "LIPASE", "AMYLASE" in the last 168 hours. Recent Labs  Lab 02/25/23 0850  AMMONIA 14   Coagulation Profile: No results for input(s): "INR", "PROTIME" in the last 168 hours. Cardiac Enzymes: No results for input(s): "CKTOTAL", "CKMB", "CKMBINDEX", "TROPONINI" in the last 168 hours. BNP (last 3 results) No results for input(s): "PROBNP" in the last 8760 hours. HbA1C: No results for input(s): "HGBA1C" in the last 72 hours. CBG: Recent Labs  Lab 02/26/23 1950 02/26/23 2330 02/27/23 0329 02/27/23 0727 02/27/23 1117  GLUCAP 101* 90 85 101* 83    Lipid Profile: No results for input(s): "CHOL", "HDL", "LDLCALC", "TRIG", "CHOLHDL", "LDLDIRECT" in the last 72 hours. Thyroid Function Tests: No results for input(s): "TSH", "T4TOTAL", "FREET4", "T3FREE", "THYROIDAB" in the last 72 hours. Anemia Panel: No results for input(s): "VITAMINB12", "FOLATE", "FERRITIN", "TIBC", "IRON", "RETICCTPCT" in the last 72 hours. Sepsis Labs: No results for input(s): "PROCALCITON", "LATICACIDVEN" in the last 168 hours.  Recent Results (from the past 240 hour(s))  MRSA Next Gen by PCR, Nasal     Status: None   Collection Time: 02/25/23 12:48 PM   Specimen: Nasal Mucosa; Nasal Swab  Result Value Ref Range Status   MRSA by PCR Next Gen NOT DETECTED NOT DETECTED Final    Comment: (NOTE) The GeneXpert MRSA Assay (FDA approved for NASAL specimens only), is one component of a comprehensive MRSA colonization surveillance program. It is not intended to diagnose MRSA infection nor to guide or monitor treatment for MRSA infections. Test performance is not FDA approved in patients less than 29 years old. Performed at [facility], [address], [place], [place] [address]          Radiology Studies: No results found.      Scheduled Meds:  bethanechol  10 mg Oral TID   Chlorhexidine Gluconate Cloth  6 each Topical Daily   enoxaparin (LOVENOX) injection  40 mg Subcutaneous Q24H   folic acid  1 mg Oral Daily   multivitamin with minerals  1 tablet Oral Daily   mouth rinse  15 mL Mouth Rinse 4 times per day   phenobarbital  64.8 mg Oral Q8H   Followed by   [name] ON 03/03/2023] phenobarbital  32.4 mg Oral Q8H   [START ON 03/04/2023] thiamine  100 mg Oral Daily   Or   [START ON 03/04/2023] thiamine  100 mg Intravenous Daily   Continuous Infusions:  sodium chloride Stopped (02/28/23 1046)   thiamine (VITAMIN B1) injection 100 mL/hr at 02/28/23 1100     LOS: 4 days    Time spent: 45 minutes spent on chart review, discussion with nursing  staff, consultants, updating family and interview/physical exam; more than 50% of that time was spent in counseling and/or coordination of care.    Joseph Art, DO [facility] Available via Epic secure chat 7am-7pm After these hours, please refer to coverage provider listed on [email or web address] 03/01/2023, 10:45 AM

## 2023-03-01 NOTE — Consult Note (Signed)
[facility] Face-to-Face Psychiatry Consult   Reason for Consult:  Alcohol use Referring Physician:  Dr. Chestine Spore Patient Identification: Anthony [name] MRN:  [id number] Principal Diagnosis: AMS (altered mental status) Diagnosis:  Principal Problem:   AMS (altered mental status) Active Problems:   Alcoholism (HCC)   Substance induced mood disorder (HCC)   Total Time spent with patient: 1 hour  Subjective:   Anthony [name] is a 57 y.o. male patient admitted with DT's. Patient is seen today after 3 day hospital stay of high acuity that resulted in ICU admission for precedex in setting of EtOH withdrawal. Psychiatry has been following intermittently as his withdrawal is medically treated.    Initial eval:  He endorses multiple acute symptoms of mania at this time to include impulsivity, irritability, mood lability, pressured speech.  He endorses mood swings up to 4+ times a day. He also reports an incident of road age when assessing for anger and agitation and irritability. He states he has always had irritability. He reports decreased need for sleep "2-3 hours a night and is able to function the rest of the day. He does not present with any of the above symptoms on this evaluation.During the evaluation he gets easily distracted and forgetful.  He further endorses depressive symptoms to include anhedonia, hopelessness, worthlessness, anhedonia, tearful, withdrawn, isolation, lack of motivation, guilty.  He denies any acute psychosis, paranoia.  He does not appear to be displaying any or responding to internal stimuli, external stimuli, or exhibiting delusional thought disorder.  When assessing for eating disorders, he denies any history but admits to purging after drinking 18 beers "to keep the buzz." When assessing for trauma related symptoms he becomes tearful and silent. Patient is told to refrain from trauma history as this time as he was becoming acutely distressed. Patient denies any access to weapons,  denies any nicotine, vaping,  and or substance abuse.  In regards to his alcohol use, he states he has been drinking since the age of 53. He reports drinking 18 beers a day. He denies any acute alcohol withdrawal symptoms related to alcohol cessation. He reports his longest period of sobriety being 9 months following discharge from [facility] and [facility]. He reports having been to inpatient rehab and detox numerous times. He reports poor sleep and poor appetite. "Diet consists of chips and bread only.   Patient denies any auditory and/or visual hallucinations, does not appear to be responding to internal or external stimuli.  There is no evidence of delusional thought content and patient appears to answer all questions appropriately.     Patient denies having a mental health diagnosis and reports that he has received outpatient counseling or medication management.   Patient states that he currently lives with his wife and 37 year old son. He is an Personnel officer.    On evaluation patient is alert and oriented x 4.  Patient is calm and cooperative, engages well with this psychiatric provider.  Patient's eye contact, speech, mood, all appear to be normal.  Patient minimizes his suicidality as well as his alcohol use disorder. He lacks insight and judgment and unable to understand the negative impact of alcohol on his medical conditions and mental health. He does refute suicidality. He further denies any recent suicidal thoughts, suicidal ideations, and or nonsuicidal self-injurious behavior.  Patient acknowledges poor coping skills and maladaptive behaviors, that he has learned over time and admits that he has to work on these things.  HPI:  Anthony [name] is a 57 year old male, with a history MDD and alcoholism, admitted overnight seeking alcohol detox. Nursing staff notified this writer that patient has been increasingly medically unstable, confused, disoriented to self and situation, with  persistent tremors for last few hours. Patient current CIWA score per nursing is 16, and he has responded poorly to antipsychotics and ativan., which were last given 1.5 hr ago. He initially presented to the [facility] but was transferred for reasons listed above. Thus a psychiatric consult was placed as he required an admission.   On reassessment today, introduced myself to the pt. He remembers bits and pieces of the last few days, and states that this is the worst detox experience he has ever had. He has been grateful for having his wife and sister visit him in the hospital. He expresses regret over relapse, stating he had been doing "really well" before this. He is open to a range of options for further treatment. We discussed (at length) naltrexone and antidepressants (opened up about low mood, guilt, etc). Staunchly denies he was trying to drink himself to death although recognizes that this is an accurate description of his actions from the outside.  He consented to naltrexone after discussion of r/b/se but declined all medications that could case erectile dysfunction so held off on antidepressant. No Si, HI, AH/VH or issues in appetite. Sleep improved. I did not see any evidence of confusion or confabulation on my exam (early on in course there was concern for wernicke's).   Past Psychiatric History: Per patient only has a history of Alcohol use disorder. He denies any eating disorder, depression or anxiety disorder. He reports only taking thiamine only.   Risk to Self:   Denies Risk to Others:   Denies Prior Inpatient Therapy:   Denies Prior Outpatient Therapy:   Wendie Chess at [facility]  Past Medical History:  Past Medical History:  Diagnosis Date   Alcohol withdrawal syndrome (HCC)    Alcoholism (HCC)    Anxiety    Depression    ED (erectile dysfunction)    GERD (gastroesophageal reflux disease)    Suicidal ideation    Tinnitus     Past Surgical History:  Procedure Laterality Date    LEG SURGERY Right    broken leg   ROTATOR CUFF REPAIR Bilateral    Family History:  Family History  Problem Relation Age of Onset   Diabetes Mother    Family Psychiatric  History: Mother- "she takes klonopin whatever that is used for. Father-GAD and Depression Social History:  Social History   Substance and Sexual Activity  Alcohol Use Yes   Comment: 3 per day     Social History   Substance and Sexual Activity  Drug Use Never    Social History   Socioeconomic History   Marital status: Divorced    Spouse name: Not on file   Number of children: Not on file   Years of education: Not on file   Highest education level: Not on file  Occupational History   Occupation: electrician  Tobacco Use   Smoking status: Never   Smokeless tobacco: Never  Vaping Use   Vaping status: Never Used  Substance and Sexual Activity   Alcohol use: Yes    Comment: 3 per day   Drug use: Never   Sexual activity: Not on file  Other Topics Concern   Not on file  Social History Narrative   Patient is married he  has a son born in 2010.   He is an Personnel officer at [employer]   He has alcoholism and is currently drinking   No tobacco or drug use   Social Determinants of Corporate investment banker Strain: Not on file  Food Insecurity: Not on file  Transportation Needs: Not on file  Physical Activity: Not on file  Stress: Not on file  Social Connections: Unknown (11/19/2021)   Received from [facility]   Social Network    Social Network: Not on file   Additional Social History:    Allergies:  No Known Allergies  Labs:  Results for orders placed or performed during the hospital encounter of 02/25/23 (from the past 48 hour(s))  Renal function panel     Status: Abnormal   Collection Time: 02/28/23  4:53 AM  Result Value Ref Range   Sodium 131 (L) 135 - 145 mmol/L   Potassium 2.9 (L) 3.5 - 5.1 mmol/L   Chloride 95 (L) 98 - 111 mmol/L   CO2 23 22 - 32 mmol/L   Glucose, Bld 80 70  - 99 mg/dL    Comment: Glucose reference range applies only to samples taken after fasting for at least 8 hours.   BUN 7 6 - 20 mg/dL   Creatinine, Ser 1.61 0.61 - 1.24 mg/dL   Calcium 8.9 8.9 - 09.6 mg/dL   Phosphorus 3.5 2.5 - 4.6 mg/dL   Albumin 3.0 (L) 3.5 - 5.0 g/dL   GFR, Estimated >04 >54 mL/min    Comment: (NOTE) Calculated using the CKD-EPI Creatinine Equation (2021)    Anion gap 13 5 - 15    Comment: Performed at [facility], [address] [place], [place] [address]  Renal function panel     Status: Abnormal   Collection Time: 03/01/23  5:52 AM  Result Value Ref Range   Sodium 136 135 - 145 mmol/L   Potassium 3.2 (L) 3.5 - 5.1 mmol/L   Chloride 99 98 - 111 mmol/L   CO2 23 22 - 32 mmol/L   Glucose, Bld 115 (H) 70 - 99 mg/dL    Comment: Glucose reference range applies only to samples taken after fasting for at least 8 hours.   BUN 8 6 - 20 mg/dL   Creatinine, Ser 9.14 0.61 - 1.24 mg/dL   Calcium 9.0 8.9 - 78.2 mg/dL   Phosphorus 3.7 2.5 - 4.6 mg/dL   Albumin 2.9 (L) 3.5 - 5.0 g/dL   GFR, Estimated >95 >62 mL/min    Comment: (NOTE) Calculated using the CKD-EPI Creatinine Equation (2021)    Anion gap 14 5 - 15    Comment: Performed at [facility], [address], [place], [place] [address]  Magnesium     Status: None   Collection Time: 03/01/23  5:52 AM  Result Value Ref Range   Magnesium 1.9 1.7 - 2.4 mg/dL    Comment: Performed at [facility], [address] [place], [place] [address]    Current Facility-Administered Medications  Medication Dose Route Frequency Provider Last Rate Last Admin   0.9 %  sodium chloride infusion   Intravenous PRN Wynona Neat, Adewale A, MD   Stopped at 02/28/23 1046   bethanechol (URECHOLINE) tablet 10 mg  10 mg Oral TID Virl Diamond A, MD   10 mg at 03/01/23 1635   Chlorhexidine Gluconate Cloth 2 % PADS 6 each  6 each Topical Daily Lorin Glass, MD   6  each at 03/01/23 1203   docusate sodium (COLACE) capsule  100 mg  100 mg Oral BID PRN Minor, Vilinda Blanks, NP       enoxaparin (LOVENOX) injection 40 mg  40 mg Subcutaneous Q24H Karie Fetch P, DO   40 mg at 03/01/23 0981   folic acid (FOLVITE) tablet 1 mg  1 mg Oral Daily Bell, Lorin C, RPH   1 mg at 03/01/23 1914   multivitamin with minerals tablet 1 tablet  1 tablet Oral Daily Achille Rich, PA-C   1 tablet at 03/01/23 7829   naltrexone (DEPADE) tablet 50 mg  50 mg Oral Daily Aksel Bencomo A   50 mg at 03/01/23 1635   Oral care mouth rinse  15 mL Mouth Rinse 4 times per day Karie Fetch P, DO   15 mL at 03/01/23 1636   Oral care mouth rinse  15 mL Mouth Rinse PRN Karie Fetch P, DO       PHENobarbital (LUMINAL) tablet 64.8 mg  64.8 mg Oral Q8H Marrianne Mood, MD   64.8 mg at 03/01/23 1634   Followed by   [name] ON 03/03/2023] PHENobarbital (LUMINAL) tablet 32.4 mg  32.4 mg Oral Q8H Marrianne Mood, MD       polyethylene glycol (MIRALAX / GLYCOLAX) packet 17 g  17 g Oral Daily PRN Minor, Vilinda Blanks, NP       thiamine (VITAMIN B1) 250 mg in sodium chloride 0.9 % 50 mL IVPB  250 mg Intravenous Daily Knute Neu, RPH 100 mL/hr at 03/01/23 1101 250 mg at 03/01/23 1101   [START ON 03/04/2023] thiamine (VITAMIN B1) tablet 100 mg  100 mg Oral Daily Knute Neu, [facility]       Or   [START ON 03/04/2023] thiamine (VITAMIN B1) injection 100 mg  100 mg Intravenous Daily Knute Neu, [facility]        Musculoskeletal: Strength & Muscle Tone: within normal limits Gait & Station: normal Patient leans: N/A            Psychiatric Specialty Exam:  Presentation  General Appearance:  Appropriate for Environment; Casual  Eye Contact: Fair  Speech: Clear and Coherent; Normal Rate  Speech Volume: Normal  Handedness: Right   Mood and Affect  Mood: Anxious; Irritable  Affect: Congruent; Tearful   Thought Process  Thought Processes: Coherent; Linear  Descriptions of Associations:Tangential  Orientation:Full (Time, Place and  Person)  Thought Content:Logical  History of Schizophrenia/Schizoaffective disorder:No  Duration of Psychotic Symptoms:No data recorded Hallucinations:No data recorded  Ideas of Reference:None  Suicidal Thoughts:No data recorded  Homicidal Thoughts:No data recorded   Sensorium  Memory: Immediate Fair; Recent Good; Remote Fair  Judgment: Poor  Insight: Poor   Executive Functions  Concentration: Fair  Attention Span: Poor  Recall: Poor  Fund of Knowledge: Fair  Language: Fair   Psychomotor Activity  Psychomotor Activity: No data recorded   Assets  Assets: Communication Skills; Desire for Improvement; Financial Resources/Insurance; Social Support; Resilience; Housing   Sleep  Sleep: No data recorded   Physical Exam: Physical Exam Vitals and nursing note reviewed.  Constitutional:      Appearance: Normal appearance. He is normal weight.  Neurological:     General: No focal deficit present.     Mental Status: He is alert and oriented to person, place, and time. Mental status is at baseline.  Psychiatric:        Attention and Perception: Perception normal. He is inattentive.  Mood and Affect: Mood is anxious. Affect is labile.        Speech: Speech normal.        Behavior: Behavior is cooperative.        Thought Content: Thought content normal.        Cognition and Memory: Cognition and memory normal.        Judgment: Judgment is impulsive.    Review of Systems  Neurological:  Positive for tremors.  Psychiatric/Behavioral:  Positive for depression and substance abuse (etoh). The patient is nervous/anxious and has insomnia.   All other systems reviewed and are negative.  Blood pressure (!) 156/105, pulse 84, temperature 98.6 F (37 C), temperature source Oral, resp. rate 17, weight 70.2 kg, SpO2 97%. Body mass index is 22.86 kg/m.  Treatment Plan Summary: Daily contact with patient to assess and evaluate symptoms and progress in  treatment, Medication management, and Plan    -Continue IV thiamine 250 daily. -Continue Phenobarb taper -TOC referral for inpatient psych once medically stable. - Recommend continue ongoing efforts for long term inpatient rehab once medically stable.  -At this time patient is willing to go voluntary, in the event he attempts to leave will need to be placed under IVC. He is a moderate to high risk suicide attempt, with limited non-modifiable risk factors.  -Once stable will discuss medication for management of antidepressant or antipsychotic. Patient historically is noncompliant, with no insight on alcohol use.   Thiamine is pending  Psychiatry will continue to monitor.  Disposition: Recommend psychiatric Inpatient admission when medically cleared.  Young Berry Keani Gotcher 03/01/2023 6:03 PM

## 2023-03-01 NOTE — Progress Notes (Signed)
Pt has become severely agitated. There is concern for safety of patient and staff. He is unable to be redirected and is refusing to participate in conversation. QT was prolonged on most recent EKG. Plan to give a dose of Versed.

## 2023-03-02 DIAGNOSIS — F1020 Alcohol dependence, uncomplicated: Secondary | ICD-10-CM | POA: Diagnosis not present

## 2023-03-02 DIAGNOSIS — F1994 Other psychoactive substance use, unspecified with psychoactive substance-induced mood disorder: Secondary | ICD-10-CM | POA: Diagnosis not present

## 2023-03-02 DIAGNOSIS — R410 Disorientation, unspecified: Secondary | ICD-10-CM | POA: Diagnosis not present

## 2023-03-02 DIAGNOSIS — F10931 Alcohol use, unspecified with withdrawal delirium: Secondary | ICD-10-CM | POA: Diagnosis not present

## 2023-03-02 LAB — RENAL FUNCTION PANEL
Albumin: 2.9 g/dL — ABNORMAL LOW (ref 3.5–5.0)
Anion gap: 11 (ref 5–15)
BUN: 7 mg/dL (ref 6–20)
CO2: 23 mmol/L (ref 22–32)
Calcium: 9 mg/dL (ref 8.9–10.3)
Chloride: 102 mmol/L (ref 98–111)
Creatinine, Ser: 0.68 mg/dL (ref 0.61–1.24)
GFR, Estimated: >60 mL/min (ref >60)
Glucose, Bld: 87 mg/dL (ref 70–99)
Phosphorus: 4 mg/dL (ref 2.5–4.6)
Potassium: 3.4 mmol/L — ABNORMAL LOW (ref 3.5–5.1)
Sodium: 136 mmol/L (ref 135–145)

## 2023-03-02 LAB — VITAMIN B1: Vitamin B1 (Thiamine): 343.1 nmol/L — ABNORMAL HIGH (ref 66.5–200.0)

## 2023-03-02 MED ORDER — LORAZEPAM 2 MG/ML IJ SOLN
2.0000 mg | Freq: Four times a day (QID) | INTRAMUSCULAR | Status: DC | PRN
  Administered 2023-03-02 – 2023-03-03 (×3): 2 mg via INTRAVENOUS
  Filled 2023-03-02 (×3): qty 1

## 2023-03-02 NOTE — Consult Note (Signed)
[facility] Face-to-Face Psychiatry Consult   Reason for Consult:  Alcohol use Referring Physician:  Dr. Chestine Spore Patient Identification: Anthony [name] MRN:  [id number] Principal Diagnosis: AMS (altered mental status) Diagnosis:  Principal Problem:   AMS (altered mental status) Active Problems:   Alcoholism (HCC)   Substance induced mood disorder (HCC)   Total Time spent with patient: 1 hour  Subjective:   Anthony [name] is a 57 y.o. male patient admitted with DT's. Patient is seen today after 3 day hospital stay of high acuity that resulted in ICU admission for precedex in setting of EtOH withdrawal. Psychiatry has been following intermittently as his withdrawal is medically treated.    Initial eval:  He endorses multiple acute symptoms of mania at this time to include impulsivity, irritability, mood lability, pressured speech.  He endorses mood swings up to 4+ times a day. He also reports an incident of road age when assessing for anger and agitation and irritability. He states he has always had irritability. He reports decreased need for sleep "2-3 hours a night and is able to function the rest of the day. He does not present with any of the above symptoms on this evaluation.During the evaluation he gets easily distracted and forgetful.  He further endorses depressive symptoms to include anhedonia, hopelessness, worthlessness, anhedonia, tearful, withdrawn, isolation, lack of motivation, guilty.  He denies any acute psychosis, paranoia.  He does not appear to be displaying any or responding to internal stimuli, external stimuli, or exhibiting delusional thought disorder.  When assessing for eating disorders, he denies any history but admits to purging after drinking 18 beers "to keep the buzz." When assessing for trauma related symptoms he becomes tearful and silent. Patient is told to refrain from trauma history as this time as he was becoming acutely distressed. Patient denies any access to weapons,  denies any nicotine, vaping,  and or substance abuse.  In regards to his alcohol use, he states he has been drinking since the age of 57. He reports drinking 18 beers a day. He denies any acute alcohol withdrawal symptoms related to alcohol cessation. He reports his longest period of sobriety being 9 months following discharge from [facility] and [facility]. He reports having been to inpatient rehab and detox numerous times. He reports poor sleep and poor appetite. "Diet consists of chips and bread only.   Patient denies any auditory and/or visual hallucinations, does not appear to be responding to internal or external stimuli.  There is no evidence of delusional thought content and patient appears to answer all questions appropriately.     Patient denies having a mental health diagnosis and reports that he has received outpatient counseling or medication management.   Patient states that he currently lives with his wife and 53 year old son. He is an Personnel officer.    On evaluation patient is alert and oriented x 4.  Patient is calm and cooperative, engages well with this psychiatric provider.  Patient's eye contact, speech, mood, all appear to be normal.  Patient minimizes his suicidality as well as his alcohol use disorder. He lacks insight and judgment and unable to understand the negative impact of alcohol on his medical conditions and mental health. He does refute suicidality. He further denies any recent suicidal thoughts, suicidal ideations, and or nonsuicidal self-injurious behavior.  Patient acknowledges poor coping skills and maladaptive behaviors, that he has learned over time and admits that he has to work on these things.  HPI:  Anthony [name] is a 57 year old male, with a history MDD and alcoholism, admitted overnight seeking alcohol detox. Nursing staff notified this writer that patient has been increasingly medically unstable, confused, disoriented to self and situation, with  persistent tremors for last few hours. Patient current CIWA score per nursing is 16, and he has responded poorly to antipsychotics and ativan., which were last given 1.5 hr ago. He initially presented to the [facility] but was transferred for reasons listed above. Thus a psychiatric consult was placed as he required an admission.    Anthony [name]  is a 57 y.o. male with a history significant for reported depression, alcohol use, hypertension, who presented to the [facility] for detox and suicidal ideation. Patient endorses multiple depressive symptoms to include anhedonia, sadness, hopelessness, worthlessness, suicidal thoughts, poor appetite, and anxiety. He denies any weight changes or fluctuation. Patient endorses anxiety symptoms such as excessive worrying and nervousness. Pt denies  irritability, hallucinations, and is linear, coherent, with improved affect this afternoon.  Pt states he is amenable to receive inpatient psychiatric hospitalization (dual diagnosis)  substance use disorder treatment. Pt admits that his substance use has been controlling his life and states he will take his medications this time. Patient endorses poor sleep and poor appetite. Denies SI/HI/AVH. Denies any withdraw symptoms at this time. No further concerns or questions for the treatment team at this time.   The patient conveys baseline alcohol consumption to the effective of typically consuming 18 beers on a daily basis, and conveys that he has been consuming alcohol at this typical daily frequency for greater than 15 years. Denies any known previous history of alcohol withdrawal symptoms, but also acknowledges that he has no previously discontinued his alcohol consumption for long enough to experience any ensuing symptoms consistent with alcohol withdrawal.  Denies any history of seizures.  He currently denies any chest pain, shortness of breath, cough, palpitations, nausea, vomiting, diarrhea, abdominal pain.  He has one more day  remaining on Alcohol phenobarb taper.   Based on my evaluation of the patient and patient's presentation, which includes SI, depression, and alcohol abuse issues, the patient's substance use and depressive symptoms appear to be severely negatively impacting the patient's ability to function in his activities of daily living at this time and is a danger to himself at this time. Thus, based on these factors, the patient meets criteria for inpatient dual diagnosis or inpatient rehab for psychiatric/substance abuse treatment at this time. He does meet criteria for IVC.     Past Psychiatric History: Per patient only has a history of Alcohol use disorder. He denies any eating disorder, depression or anxiety disorder. He reports only taking thiamine only.   Risk to Self:   yes Risk to Others:   Denies Prior Inpatient Therapy:   Denies Prior Outpatient Therapy:   Wendie Chess at [facility]  Past Medical History:  Past Medical History:  Diagnosis Date   Alcohol withdrawal syndrome (HCC)    Alcoholism (HCC)    Anxiety    Depression    ED (erectile dysfunction)    GERD (gastroesophageal reflux disease)    Suicidal ideation    Tinnitus     Past Surgical History:  Procedure Laterality Date   LEG SURGERY Right    broken leg   ROTATOR CUFF REPAIR Bilateral    Family History:  Family History  Problem Relation Age of Onset   Diabetes Mother    Family Psychiatric  History:  Mother- "she takes klonopin whatever that is used for. Father-GAD and Depression Social History:  Social History   Substance and Sexual Activity  Alcohol Use Yes   Comment: 3 per day     Social History   Substance and Sexual Activity  Drug Use Never    Social History   Socioeconomic History   Marital status: Divorced    Spouse name: Not on file   Number of children: Not on file   Years of education: Not on file   Highest education level: Not on file  Occupational History   Occupation: electrician  Tobacco  Use   Smoking status: Never   Smokeless tobacco: Never  Vaping Use   Vaping status: Never Used  Substance and Sexual Activity   Alcohol use: Yes    Comment: 3 per day   Drug use: Never   Sexual activity: Not on file  Other Topics Concern   Not on file  Social History Narrative   Patient is married he has a son born in 2010.   He is an Personnel officer at [employer]   He has alcoholism and is currently drinking   No tobacco or drug use   Social Determinants of Corporate investment banker Strain: Not on file  Food Insecurity: Not on file  Transportation Needs: Not on file  Physical Activity: Not on file  Stress: Not on file  Social Connections: Unknown (11/19/2021)   Received from [facility]   Social Network    Social Network: Not on file   Additional Social History:    Allergies:  No Known Allergies  Labs:  Results for orders placed or performed during the hospital encounter of 02/25/23 (from the past 48 hour(s))  Renal function panel     Status: Abnormal   Collection Time: 03/01/23  5:52 AM  Result Value Ref Range   Sodium 136 135 - 145 mmol/L   Potassium 3.2 (L) 3.5 - 5.1 mmol/L   Chloride 99 98 - 111 mmol/L   CO2 23 22 - 32 mmol/L   Glucose, Bld 115 (H) 70 - 99 mg/dL    Comment: Glucose reference range applies only to samples taken after fasting for at least 8 hours.   BUN 8 6 - 20 mg/dL   Creatinine, Ser 6.04 0.61 - 1.24 mg/dL   Calcium 9.0 8.9 - 54.0 mg/dL   Phosphorus 3.7 2.5 - 4.6 mg/dL   Albumin 2.9 (L) 3.5 - 5.0 g/dL   GFR, Estimated >98 >11 mL/min    Comment: (NOTE) Calculated using the CKD-EPI Creatinine Equation (2021)    Anion gap 14 5 - 15    Comment: Performed at [facility], [address], [place], [place] [address]  Magnesium     Status: None   Collection Time: 03/01/23  5:52 AM  Result Value Ref Range   Magnesium 1.9 1.7 - 2.4 mg/dL    Comment: Performed at [facility], [address], [place], [place] [address]  Renal  function panel     Status: Abnormal   Collection Time: 03/02/23  7:06 AM  Result Value Ref Range   Sodium 136 135 - 145 mmol/L   Potassium 3.4 (L) 3.5 - 5.1 mmol/L   Chloride 102 98 - 111 mmol/L   CO2 23 22 - 32 mmol/L   Glucose, Bld 87 70 - 99 mg/dL    Comment: Glucose reference range applies only to samples taken after fasting for at least 8  hours.   BUN 7 6 - 20 mg/dL   Creatinine, Ser 8.65 0.61 - 1.24 mg/dL   Calcium 9.0 8.9 - 78.4 mg/dL   Phosphorus 4.0 2.5 - 4.6 mg/dL   Albumin 2.9 (L) 3.5 - 5.0 g/dL   GFR, Estimated >69 >62 mL/min    Comment: (NOTE) Calculated using the CKD-EPI Creatinine Equation (2021)    Anion gap 11 5 - 15    Comment: Performed at [facility], [address], [place], [place] [address]    Current Facility-Administered Medications  Medication Dose Route Frequency Provider Last Rate Last Admin   0.9 %  sodium chloride infusion   Intravenous PRN Wynona Neat, Adewale A, MD   Stopped at 02/28/23 1046   bethanechol (URECHOLINE) tablet 10 mg  10 mg Oral TID Virl Diamond A, MD   10 mg at 03/02/23 0845   Chlorhexidine Gluconate Cloth 2 % PADS 6 each  6 each Topical Daily Lorin Glass, MD   6 each at 03/02/23 1324   docusate sodium (COLACE) capsule 100 mg  100 mg Oral BID PRN Minor, Vilinda Blanks, NP       enoxaparin (LOVENOX) injection 40 mg  40 mg Subcutaneous Q24H Karie Fetch P, DO   40 mg at 03/02/23 0845   folic acid (FOLVITE) tablet 1 mg  1 mg Oral Daily Bell, Lorin C, RPH   1 mg at 03/02/23 0845   LORazepam (ATIVAN) injection 2 mg  2 mg Intravenous Q6H PRN Marlin Canary U, DO   2 mg at 03/02/23 1153   multivitamin with minerals tablet 1 tablet  1 tablet Oral Daily Achille Rich, PA-C   1 tablet at 03/02/23 4010   naltrexone (DEPADE) tablet 50 mg  50 mg Oral Daily Cinderella, Margaret A   50 mg at 03/02/23 0848   Oral care mouth rinse  15 mL Mouth Rinse 4 times per day Karie Fetch P, DO   15 mL at 03/02/23 1158   Oral care mouth rinse  15 mL Mouth  Rinse PRN Karie Fetch P, DO       PHENobarbital (LUMINAL) tablet 64.8 mg  64.8 mg Oral Q8H Marrianne Mood, MD   64.8 mg at 03/02/23 1405   Followed by   [name] ON 03/03/2023] PHENobarbital (LUMINAL) tablet 32.4 mg  32.4 mg Oral Q8H Marrianne Mood, MD       polyethylene glycol (MIRALAX / GLYCOLAX) packet 17 g  17 g Oral Daily PRN Minor, Vilinda Blanks, NP       thiamine (VITAMIN B1) 250 mg in sodium chloride 0.9 % 50 mL IVPB  250 mg Intravenous Daily Knute Neu, RPH 100 mL/hr at 03/02/23 0853 250 mg at 03/02/23 0853   [START ON 03/04/2023] thiamine (VITAMIN B1) tablet 100 mg  100 mg Oral Daily Knute Neu, [facility]       Or   [START ON 03/04/2023] thiamine (VITAMIN B1) injection 100 mg  100 mg Intravenous Daily Knute Neu, [facility]        Musculoskeletal: Strength & Muscle Tone: within normal limits Gait & Station: normal Patient leans: N/A            Psychiatric Specialty Exam:  Presentation  General Appearance:  Appropriate for Environment; Casual  Eye Contact: Fair  Speech: Clear and Coherent; Normal Rate  Speech Volume: Normal  Handedness: Right   Mood and Affect  Mood: Anxious  Affect: Congruent   Thought Process  Thought Processes: Coherent; Linear  Descriptions of  Associations:Intact  Orientation:Full (Time, Place and Person)  Thought Content:Logical  History of Schizophrenia/Schizoaffective disorder:No  Duration of Psychotic Symptoms:No data recorded Hallucinations:Hallucinations: None   Ideas of Reference:None  Suicidal Thoughts:Suicidal Thoughts: No   Homicidal Thoughts:Homicidal Thoughts: No    Sensorium  Memory: Remote Good; Immediate Good; Recent Fair  Judgment: Fair  Insight: Fair   Art therapist  Concentration: Fair  Attention Span: Good  Recall: Fair  Fund of Knowledge: Good  Language: Good   Psychomotor Activity  Psychomotor Activity: Psychomotor Activity: Normal    Assets   Assets: Communication Skills; Desire for Improvement; Financial Resources/Insurance; Social Support   Sleep  Sleep: Sleep: Fair    Physical Exam: Physical Exam Vitals and nursing note reviewed.  Constitutional:      Appearance: Normal appearance. He is normal weight.  Neurological:     General: No focal deficit present.     Mental Status: He is alert and oriented to person, place, and time. Mental status is at baseline.  Psychiatric:        Attention and Perception: Perception normal. He is inattentive.        Mood and Affect: Affect normal. Mood is anxious.        Speech: Speech normal.        Behavior: Behavior normal. Behavior is cooperative.        Thought Content: Thought content normal.        Cognition and Memory: Cognition and memory normal.        Judgment: Judgment is impulsive.    Review of Systems  Neurological:  Positive for tremors.  Psychiatric/Behavioral:  Positive for depression and substance abuse (etoh). The patient is nervous/anxious and has insomnia.   All other systems reviewed and are negative.  Blood pressure (!) 152/103, pulse 86, temperature 98.5 F (36.9 C), temperature source Oral, resp. rate 18, weight 71.4 kg, SpO2 99%. Body mass index is 23.25 kg/m.  Treatment Plan Summary: Daily contact with patient to assess and evaluate symptoms and progress in treatment, Medication management, and Plan    -Continue IV thiamine 250 daily. -Continue Phenobarb taper, one more day remaining -TOC referral for inpatient psych once medically stable. - Recommend continue ongoing efforts for long term inpatient rehab once medically stable.  -At this time patient is willing to go voluntary, in the event he attempts to leave will need to be placed under IVC. He is a moderate to high risk suicide attempt, with limited non-modifiable risk factors.  -Once stable will discuss medication for management of antidepressant or antipsychotic.  Thiamine 343, albumin  2.9  Psychiatry will continue to monitor.  Disposition: Recommend psychiatric Inpatient admission when medically cleared.  Maryagnes Amos, FNP 03/02/2023 3:10 PM

## 2023-03-02 NOTE — TOC Progression Note (Signed)
Transition of Care [facility]) - Progression Note    Patient Details  Name: ABDULAH [name] MRN: [id number] Date of Birth: February 11, 1966  Transition of [facility]) CM/SW Contact  Charissa Knowles A [place], [place] Phone Number: 03/02/2023, 5:21 PM  Clinical Narrative:     CSW completed referral for inpatient psychiatric bed. Bed offers pending.   TOC will continue to follow.  Expected Discharge Plan: Home/Self Care Barriers to Discharge: Continued Medical Work up  Expected Discharge Plan and Services In-house Referral: Clinical Social Work     Living arrangements for the past 2 months: Single Family Home                                       Social Determinants of Health (SDOH) Interventions SDOH Screenings   Depression (PHQ2-9): Medium Risk (01/12/2023)  Social Connections: Unknown (11/19/2021)   Received from [facility]  Tobacco Use: Low Risk  (02/09/2023)    Readmission Risk Interventions     No data to display

## 2023-03-02 NOTE — Progress Notes (Signed)
Physical Therapy Treatment Patient Details Name: Anthony [name] MRN: [id number] DOB: 1966/02/27 Today's Date: 03/02/2023   History of Present Illness Pt is a 57 y/o M presenting to ED on 8/21 with worsening alcohol withdrawal, altered, having hallucinations, admitted for AMS. PMH includes alcohol use, MDD, anxiety, depression, GERD.    PT Comments  Pt greeted resting in bed and agreeable to session with great progress towards acute goals. Pt able to progress gait fir significantly increased distance at supervision level without AD support. Pt with some drift and favoring of R side of hallway, room, and doorway with x2 instances of bumping into objects on R. Pt aware and able to correct. Pt without overt LOB throughout session but continues to demonstrate mild instability and continues to benefit from skilled PT services to progress toward functional mobility goals.     If plan is discharge home, recommend the following: A little help with walking and/or transfers;A little help with bathing/dressing/bathroom;Assistance with cooking/housework;Direct supervision/assist for financial management;Direct supervision/assist for medications management;Assist for transportation   Can travel by private vehicle        Equipment Recommendations  None recommended by PT    Recommendations for Other Services       Precautions / Restrictions Precautions Precautions: Fall Restrictions Weight Bearing Restrictions: No     Mobility  Bed Mobility Overal bed mobility: Modified Independent                  Transfers Overall transfer level: Needs assistance Equipment used: None Transfers: Sit to/from Stand Sit to Stand: Supervision           General transfer comment: supervision for safety    Ambulation/Gait Ambulation/Gait assistance: Supervision Gait Distance (Feet): 500 Feet Assistive device: None Gait Pattern/deviations: Step-through pattern, Decreased stride length, Ataxic,  Drifts right/left Gait velocity: decr     General Gait Details: Minor drift to R and light postural sway L/R, pt with x2 instances of bumping obstacles on R as pt favoring R side of room/hall/doorway pt able to correct, no LOB   Stairs Stairs: Yes Stairs assistance: Supervision Stair Management: No rails, Alternating pattern, Forwards Number of Stairs: 12 General stair comments: up/down without rail use and without LOB   Wheelchair Mobility     Tilt Bed    Modified Rankin (Stroke Patients Only)       Balance Overall balance assessment: Needs assistance Sitting-balance support: Feet supported, No upper extremity supported Sitting balance-Leahy Scale: Normal     Standing balance support: No upper extremity supported, During functional activity Standing balance-Leahy Scale: Fair                              Cognition Arousal: Alert Behavior During Therapy: WFL for tasks assessed/performed Overall Cognitive Status: Within Functional Limits for tasks assessed                                          Exercises      General Comments General comments (skin integrity, edema, etc.): VSS on RA      Pertinent Vitals/Pain Pain Assessment Pain Assessment: No/denies pain    Home Living                          Prior Function  PT Goals (current goals can now be found in the care plan section) Acute Rehab PT Goals Patient Stated Goal: full recovery. PT Goal Formulation: With patient/family Time For Goal Achievement: 03/13/23 Progress towards PT goals: Progressing toward goals    Frequency    Min 1X/week      PT Plan      Co-evaluation              AM-PAC PT "6 Clicks" Mobility   Outcome Measure  Help needed turning from your back to your side while in a flat bed without using bedrails?: None Help needed moving from lying on your back to sitting on the side of a flat bed without using bedrails?:  None Help needed moving to and from a bed to a chair (including a wheelchair)?: None Help needed standing up from a chair using your arms (e.g., wheelchair or bedside chair)?: None Help needed to walk in hospital room?: A Little Help needed climbing 3-5 steps with a railing? : A Little 6 Click Score: 22    End of Session   Activity Tolerance: Patient tolerated treatment well Patient left: in bed;with call bell/phone within reach Nurse Communication: Mobility status PT Visit Diagnosis: Unsteadiness on feet (R26.81);Other abnormalities of gait and mobility (R26.89);Other symptoms and signs involving the nervous system (R29.898);Ataxic gait (R26.0)     Time: 5784-6962 PT Time Calculation (min) (ACUTE ONLY): 13 min  Charges:    $Gait Training: 8-22 mins PT General Charges $$ ACUTE PT VISIT: 1 Visit                     Edu On R. PTA Acute Rehabilitation Services Office: [phone]   Catalina Antigua 03/02/2023, 4:19 PM

## 2023-03-02 NOTE — Plan of Care (Signed)

## 2023-03-02 NOTE — Progress Notes (Signed)
PROGRESS NOTE    Anthony [name]  ZOX:[id number] DOB: October 14, 1965 DOA: 02/25/2023 PCP: Patient, No Pcp Per    Brief Narrative:  57 year old with long history of EtOH abuse coupled with anxiety, depression, suicidal ideation and major depression. Originally presents to behavioral health center for alcohol withdrawal but due to tachycardia and hallucinations was transferred to [facility] emergency department. He been heavily sedated with Ativan. The plan is to start him on a Precedex drip and admit him to the intensive care unit.    Assessment and Plan: EtOH withdrawal Metabolic encephalopathy- resolved -On phenobarbital taper - weaned off Precedex -Continue multivitamins, thiamine   History of depression -Appreciate psych involvement -For inpatient psych once medically stable, psych to continue to assist - TOC consulted   Hypertension -Was on losartan outpatient-- may need to be resumed   Hematuria-secondary traumatic Foley insertion Foley removed   Hyponatremia, hypokalemia -replace K   DVT prophylaxis: enoxaparin (LOVENOX) injection 40 mg Start: 02/26/23 1015 SCDs Start: 02/25/23 0941    Code Status: Full Code Family Communication: wife at bedside  Disposition Plan:  Level of care: Med-Surg Status is: Inpatient Remains inpatient appropriate    Consultants:  Psych PCCM   Subjective: Last night developed anxiety/irritation about son starting school and missing it  Objective: Vitals:   03/02/23 0058 03/02/23 0416 03/02/23 0454 03/02/23 0817  BP: (!) 149/95 129/85  (!) 152/103  Pulse: 75 84  86  Resp: 18 17  18   Temp: 98.7 F (37.1 C) 98.5 F (36.9 C)    TempSrc: Oral Oral    SpO2: 99% 95%  99%  Weight:   71.4 kg    No intake or output data in the 24 hours ending 03/02/23 1117  Filed Weights   02/28/23 0500 03/01/23 0500 03/02/23 0454  Weight: 70.7 kg 70.2 kg 71.4 kg    Examination:   General: Appearance:    Well developed, well nourished male in  no acute distress     Lungs:     respirations unlabored  Heart:    Normal heart rate. Normal rhythm. No murmurs, rubs, or gallops.    MS:   All extremities are intact.    Neurologic:   Awake, alert, oriented x 3. No apparent focal neurological           defect.        Data Reviewed: I have personally reviewed following labs and imaging studies  CBC: Recent Labs  Lab 02/25/23 0852 02/26/23 0634  WBC 5.6 11.4*  NEUTROABS 4.1  --   HGB 12.5* 13.7  HCT 36.0* 40.1  MCV 84.9 87.9  PLT 230 184   Basic Metabolic Panel: Recent Labs  Lab 02/26/23 0634 02/27/23 0425 02/28/23 0453 03/01/23 0552 03/02/23 0706  NA 138 138 131* 136 136  K 4.2 3.5 2.9* 3.2* 3.4*  CL 100 98 95* 99 102  CO2 26 24 23 23 23   GLUCOSE 106* 96 80 115* 87  BUN 7 7 7 8 7   CREATININE 0.82 0.95 0.72 0.75 0.68  CALCIUM 9.5 9.4 8.9 9.0 9.0  MG  --   --   --  1.9  --   PHOS 3.1 3.2 3.5 3.7 4.0   GFR: Estimated Creatinine Clearance: 101.9 mL/min (by C-G formula based on SCr of 0.68 mg/dL). Liver Function Tests: Recent Labs  Lab 02/25/23 8119 02/26/23 1478 02/27/23 0425 02/28/23 0453 03/01/23 0552 03/02/23 0706  AST 67*  --   --   --   --   --  ALT 31  --   --   --   --   --   ALKPHOS 56  --   --   --   --   --   BILITOT 2.3*  --   --   --   --   --   PROT 6.6  --   --   --   --   --   ALBUMIN 3.7 3.5 3.4* 3.0* 2.9* 2.9*   No results for input(s): "LIPASE", "AMYLASE" in the last 168 hours. Recent Labs  Lab 02/25/23 0850  AMMONIA 14   Coagulation Profile: No results for input(s): "INR", "PROTIME" in the last 168 hours. Cardiac Enzymes: No results for input(s): "CKTOTAL", "CKMB", "CKMBINDEX", "TROPONINI" in the last 168 hours. BNP (last 3 results) No results for input(s): "PROBNP" in the last 8760 hours. HbA1C: No results for input(s): "HGBA1C" in the last 72 hours. CBG: Recent Labs  Lab 02/26/23 1950 02/26/23 2330 02/27/23 0329 02/27/23 0727 02/27/23 1117  GLUCAP 101* 90 85 101*  83   Lipid Profile: No results for input(s): "CHOL", "HDL", "LDLCALC", "TRIG", "CHOLHDL", "LDLDIRECT" in the last 72 hours. Thyroid Function Tests: No results for input(s): "TSH", "T4TOTAL", "FREET4", "T3FREE", "THYROIDAB" in the last 72 hours. Anemia Panel: No results for input(s): "VITAMINB12", "FOLATE", "FERRITIN", "TIBC", "IRON", "RETICCTPCT" in the last 72 hours. Sepsis Labs: No results for input(s): "PROCALCITON", "LATICACIDVEN" in the last 168 hours.  Recent Results (from the past 240 hour(s))  MRSA Next Gen by PCR, Nasal     Status: None   Collection Time: 02/25/23 12:48 PM   Specimen: Nasal Mucosa; Nasal Swab  Result Value Ref Range Status   MRSA by PCR Next Gen NOT DETECTED NOT DETECTED Final    Comment: (NOTE) The GeneXpert MRSA Assay (FDA approved for NASAL specimens only), is one component of a comprehensive MRSA colonization surveillance program. It is not intended to diagnose MRSA infection nor to guide or monitor treatment for MRSA infections. Test performance is not FDA approved in patients less than 44 years old. Performed at [facility], [address], [place], [place] [address]          Radiology Studies: No results found.      Scheduled Meds:  bethanechol  10 mg Oral TID   Chlorhexidine Gluconate Cloth  6 each Topical Daily   enoxaparin (LOVENOX) injection  40 mg Subcutaneous Q24H   folic acid  1 mg Oral Daily   multivitamin with minerals  1 tablet Oral Daily   naltrexone  50 mg Oral Daily   mouth rinse  15 mL Mouth Rinse 4 times per day   phenobarbital  64.8 mg Oral Q8H   Followed by   [name] ON 03/03/2023] phenobarbital  32.4 mg Oral Q8H   [START ON 03/04/2023] thiamine  100 mg Oral Daily   Or   [START ON 03/04/2023] thiamine  100 mg Intravenous Daily   Continuous Infusions:  sodium chloride Stopped (02/28/23 1046)   thiamine (VITAMIN B1) injection 250 mg (03/02/23 0853)     LOS: 5 days    Time spent: 45 minutes spent on chart  review, discussion with nursing staff, consultants, updating family and interview/physical exam; more than 50% of that time was spent in counseling and/or coordination of care.    Joseph Art, DO [facility] Available via Epic secure chat 7am-7pm After these hours, please refer to coverage provider listed on [email or web address] 03/02/2023, 11:17 AM

## 2023-03-03 ENCOUNTER — Encounter (HOSPITAL_COMMUNITY): Payer: Self-pay | Admitting: Internal Medicine

## 2023-03-03 ENCOUNTER — Telehealth (HOSPITAL_COMMUNITY): Payer: Self-pay | Admitting: Licensed Clinical Social Worker

## 2023-03-03 DIAGNOSIS — F1994 Other psychoactive substance use, unspecified with psychoactive substance-induced mood disorder: Secondary | ICD-10-CM | POA: Diagnosis not present

## 2023-03-03 DIAGNOSIS — F1020 Alcohol dependence, uncomplicated: Secondary | ICD-10-CM | POA: Diagnosis not present

## 2023-03-03 DIAGNOSIS — F10931 Alcohol use, unspecified with withdrawal delirium: Secondary | ICD-10-CM | POA: Diagnosis not present

## 2023-03-03 DIAGNOSIS — R410 Disorientation, unspecified: Secondary | ICD-10-CM | POA: Diagnosis not present

## 2023-03-03 LAB — RENAL FUNCTION PANEL
Albumin: 3.3 g/dL — ABNORMAL LOW (ref 3.5–5.0)
Anion gap: 7 (ref 5–15)
BUN: 5 mg/dL — ABNORMAL LOW (ref 6–20)
CO2: 25 mmol/L (ref 22–32)
Calcium: 9.2 mg/dL (ref 8.9–10.3)
Chloride: 102 mmol/L (ref 98–111)
Creatinine, Ser: 0.71 mg/dL (ref 0.61–1.24)
GFR, Estimated: >60 mL/min (ref >60)
Glucose, Bld: 89 mg/dL (ref 70–99)
Phosphorus: 3.9 mg/dL (ref 2.5–4.6)
Potassium: 3.7 mmol/L (ref 3.5–5.1)
Sodium: 134 mmol/L — ABNORMAL LOW (ref 135–145)

## 2023-03-03 MED ORDER — ORAL CARE MOUTH RINSE: 15.0000 mL | OROMUCOSAL | Status: DC | PRN

## 2023-03-03 MED ORDER — LOSARTAN POTASSIUM 50 MG PO TABS
25.0000 mg | ORAL_TABLET | Freq: Every day | ORAL | Status: DC
  Administered 2023-03-03: 25 mg via ORAL
  Filled 2023-03-03 (×2): qty 1

## 2023-03-03 MED ORDER — PHENOBARBITAL 32.4 MG PO TABS
64.8000 mg | ORAL_TABLET | Freq: Once | ORAL | Status: AC
  Administered 2023-03-03: 64.8 mg via ORAL
  Filled 2023-03-03: qty 2

## 2023-03-03 MED ORDER — BUPROPION HCL 75 MG PO TABS
75.0000 mg | ORAL_TABLET | Freq: Every day | ORAL | Status: DC
  Administered 2023-03-03 – 2023-03-04 (×2): 75 mg via ORAL
  Filled 2023-03-03 (×2): qty 1

## 2023-03-03 NOTE — Telephone Encounter (Signed)
The therapist returns Galvin's wife's call confirming her identity via two identifiers. She is worried about his aftercare and upset that he has not taken her seriously when she told him that he could have died from this recent relapse. She says that when she attempts to set limits that he will use guilt trips saying things like, "It's all about your [name]."  The therapist informs her that she does not need to convince him of anything or have any involvement in his aftercare as he has a team of professionals who can do this and that her only job is to figure out how to protect herself and her son from Devontay's disease such as asking him to move out if he continues drinking or take her son and move somewhere else to get away from him. The therapist also explains the reason that she needs to ignore his efforts at making her feel guilty.  His wife will call this therapist back on a p.r.n. basis.  Myrna Blazer, MA, LCSW, [facility], LCAS 03/03/2023

## 2023-03-03 NOTE — TOC Progression Note (Signed)
Transition of Care [facility]) - Progression Note    Patient Details  Name: HANISH HAMILTON MRN: [id number] Date of Birth: [birth year]  [facility]) CM/SW Contact  Nicloe Frontera A [place], [place] Phone Number: 03/03/2023, 10:35 AM  Clinical Narrative:     Bed offers still pending for inpatient psych.   TOC will continue to follow.   Expected Discharge Plan: Home/Self Care Barriers to Discharge: Continued Medical Work up  Expected Discharge Plan and Services In-house Referral: Clinical Social Work     Living arrangements for the past 2 months: Single Family Home                                       Social Determinants of Health (SDOH) Interventions SDOH Screenings   Depression (PHQ2-9): Medium Risk (01/12/2023)  Social Connections: Unknown (11/19/2021)   Received from [facility]  Tobacco Use: Low Risk  (02/09/2023)    Readmission Risk Interventions     No data to display

## 2023-03-03 NOTE — Progress Notes (Signed)
Occupational Therapy Treatment Patient Details Name: Anthony [name] MRN: [id number] DOB: 27-Oct-1965 Today's Date: 03/03/2023   History of present illness Pt is a 57 y/o M presenting to ED on 8/21 with worsening alcohol withdrawal, altered, having hallucinations, admitted for AMS. PMH includes alcohol use, MDD, anxiety, depression, GERD.   OT comments  Patient seated on EOB upon entry and was able to doff socks and donn shoes without assistance. Patient demonstrating good gains with patient able to follow 4 step without cues and supervision for dynamic standing tasks. Acute OT to continue to follow to increase safety with balance and self care tasks.       If plan is discharge home, recommend the following:  Direct supervision/assist for medications management;Direct supervision/assist for financial management;Assistance with cooking/housework   Equipment Recommendations  None recommended by OT    Recommendations for Other Services      Precautions / Restrictions Precautions Precautions: Fall Restrictions Weight Bearing Restrictions: No       Mobility Bed Mobility Overal bed mobility: Modified Independent             General bed mobility comments: seated on EOB upon entry    Transfers Overall transfer level: Needs assistance Equipment used: None Transfers: Sit to/from Stand Sit to Stand: Supervision           General transfer comment: supervision for safety     Balance Overall balance assessment: Needs assistance Sitting-balance support: Feet supported, No upper extremity supported Sitting balance-Leahy Scale: Normal     Standing balance support: No upper extremity supported, During functional activity Standing balance-Leahy Scale: Fair Standing balance comment: dynamic standing balance activities with supervision                           ADL either performed or assessed with clinical judgement   ADL Overall ADL's : Needs assistance/impaired                      Lower Body Dressing: Supervision/safety Lower Body Dressing Details (indicate cue type and reason): able to doff socks and donn shoes without assistance               General ADL Comments: Patient states he gave himself a shower earlier today    Extremity/Trunk Assessment              Vision       Perception     Praxis      Cognition Arousal: Alert Behavior During Therapy: WFL for tasks assessed/performed Overall Cognitive Status: Within Functional Limits for tasks assessed                                 General Comments: able to follow multi step instructions        Exercises      Shoulder Instructions       General Comments      Pertinent Vitals/ Pain       Pain Assessment Pain Assessment: No/denies pain  Home Living                                          Prior Functioning/Environment              Frequency  Min 1X/week  Progress Toward Goals  OT Goals(current goals can now be found in the care plan section)  Progress towards OT goals: Progressing toward goals  Acute Rehab OT Goals Patient Stated Goal: go home OT Goal Formulation: With patient Time For Goal Achievement: 03/14/23 Potential to Achieve Goals: Good ADL Goals Additional ADL Goal #1: pt will follow 4 step instruction with min cues in prep for ADLs Additional ADL Goal #2: pt will maintain good standing balance during functional tasks in prep for ADLs  Plan      Co-evaluation                 AM-PAC OT "6 Clicks" Daily Activity     Outcome Measure   Help from another person eating meals?: None Help from another person taking care of personal grooming?: None Help from another person toileting, which includes using toliet, bedpan, or urinal?: None Help from another person bathing (including washing, rinsing, drying)?: A Little Help from another person to put on and taking off regular upper body  clothing?: A Little Help from another person to put on and taking off regular lower body clothing?: A Little 6 Click Score: 21    End of Session    OT Visit Diagnosis: Unsteadiness on feet (R26.81);Other abnormalities of gait and mobility (R26.89);Muscle weakness (generalized) (M62.81)   Activity Tolerance Patient tolerated treatment well   Patient Left in bed;with call bell/phone within reach (seated on EOB)   Nurse Communication Mobility status        Time: 0981-1914 OT Time Calculation (min): 26 min  Charges: OT General Charges $OT Visit: 1 Visit OT Treatments $Self Care/Home Management : 8-22 mins $Therapeutic Activity: 8-22 mins  Alfonse Flavors, OTA Acute Rehabilitation Services  Office [phone]   Dewain Penning 03/03/2023, 2:16 PM

## 2023-03-03 NOTE — Progress Notes (Signed)
PROGRESS NOTE    Anthony [name]  NWG:[id number] DOB: 1966-04-17 DOA: 02/25/2023 PCP: Patient, No Pcp Per    Brief Narrative:  57 year old with long history of EtOH abuse coupled with anxiety, depression, suicidal ideation and major depression. Originally presents to behavioral health center for alcohol withdrawal but due to tachycardia and hallucinations was transferred to [facility] emergency department. He been heavily sedated with Ativan. The plan is to start him on a Precedex drip and admit him to the intensive care unit.    Assessment and Plan: EtOH withdrawal Metabolic encephalopathy- resolved -On phenobarbital taper - weaned off Precedex -Continue multivitamins, thiamine   History of depression -Appreciate psych involvement -For inpatient psych once medically stable, psych to continue to assist - TOC consulted   Hypertension -Was on losartan outpatient-- will resume dose   Hematuria-secondary traumatic Foley insertion Foley removed   Hyponatremia, hypokalemia -replace K   DVT prophylaxis: enoxaparin (LOVENOX) injection 40 mg Start: 02/26/23 1015 SCDs Start: 02/25/23 0941    Code Status: Full Code Family Communication: wife at bedside 8/26  Disposition Plan:  Level of care: Med-Surg Status is: Inpatient Remains inpatient appropriate    Consultants:  Psych PCCM   Subjective: Would like update on placement  Objective: Vitals:   03/02/23 1530 03/02/23 2014 03/03/23 0449 03/03/23 0817  BP: (!) 142/96 (!) 152/91 (!) 127/99 (!) 149/97  Pulse: 80 77 86 71  Resp:  18 18 18   Temp: (!) 97.5 F (36.4 C) 98.1 F (36.7 C) 97.9 F (36.6 C) 98.3 F (36.8 C)  TempSrc: Oral Oral Oral Oral  SpO2: 100% 100% 98% 98%  Weight:       No intake or output data in the 24 hours ending 03/03/23 1058  Filed Weights   02/28/23 0500 03/01/23 0500 03/02/23 0454  Weight: 70.7 kg 70.2 kg 71.4 kg    Examination:    General: Appearance:    Well developed, well nourished  male in no acute distress     Lungs:     Clear to auscultation bilaterally, respirations unlabored  Heart:    Normal heart rate.   MS:   All extremities are intact.   Neurologic:   Awake, alert, irritable       Data Reviewed: I have personally reviewed following labs and imaging studies  CBC: Recent Labs  Lab 02/25/23 0852 02/26/23 0634  WBC 5.6 11.4*  NEUTROABS 4.1  --   HGB 12.5* 13.7  HCT 36.0* 40.1  MCV 84.9 87.9  PLT 230 184   Basic Metabolic Panel: Recent Labs  Lab 02/26/23 0634 02/27/23 0425 02/28/23 0453 03/01/23 0552 03/02/23 0706  NA 138 138 131* 136 136  K 4.2 3.5 2.9* 3.2* 3.4*  CL 100 98 95* 99 102  CO2 26 24 23 23 23   GLUCOSE 106* 96 80 115* 87  BUN 7 7 7 8 7   CREATININE 0.82 0.95 0.72 0.75 0.68  CALCIUM 9.5 9.4 8.9 9.0 9.0  MG  --   --   --  1.9  --   PHOS 3.1 3.2 3.5 3.7 4.0   GFR: Estimated Creatinine Clearance: 101.9 mL/min (by C-G formula based on SCr of 0.68 mg/dL). Liver Function Tests: Recent Labs  Lab 02/25/23 5784 02/26/23 6962 02/27/23 0425 02/28/23 0453 03/01/23 0552 03/02/23 0706  AST 67*  --   --   --   --   --   ALT 31  --   --   --   --   --  ALKPHOS 56  --   --   --   --   --   BILITOT 2.3*  --   --   --   --   --   PROT 6.6  --   --   --   --   --   ALBUMIN 3.7 3.5 3.4* 3.0* 2.9* 2.9*   No results for input(s): "LIPASE", "AMYLASE" in the last 168 hours. Recent Labs  Lab 02/25/23 0850  AMMONIA 14   Coagulation Profile: No results for input(s): "INR", "PROTIME" in the last 168 hours. Cardiac Enzymes: No results for input(s): "CKTOTAL", "CKMB", "CKMBINDEX", "TROPONINI" in the last 168 hours. BNP (last 3 results) No results for input(s): "PROBNP" in the last 8760 hours. HbA1C: No results for input(s): "HGBA1C" in the last 72 hours. CBG: Recent Labs  Lab 02/26/23 1950 02/26/23 2330 02/27/23 0329 02/27/23 0727 02/27/23 1117  GLUCAP 101* 90 85 101* 83   Lipid Profile: No results for input(s): "CHOL",  "HDL", "LDLCALC", "TRIG", "CHOLHDL", "LDLDIRECT" in the last 72 hours. Thyroid Function Tests: No results for input(s): "TSH", "T4TOTAL", "FREET4", "T3FREE", "THYROIDAB" in the last 72 hours. Anemia Panel: No results for input(s): "VITAMINB12", "FOLATE", "FERRITIN", "TIBC", "IRON", "RETICCTPCT" in the last 72 hours. Sepsis Labs: No results for input(s): "PROCALCITON", "LATICACIDVEN" in the last 168 hours.  Recent Results (from the past 240 hour(s))  MRSA Next Gen by PCR, Nasal     Status: None   Collection Time: 02/25/23 12:48 PM   Specimen: Nasal Mucosa; Nasal Swab  Result Value Ref Range Status   MRSA by PCR Next Gen NOT DETECTED NOT DETECTED Final    Comment: (NOTE) The GeneXpert MRSA Assay (FDA approved for NASAL specimens only), is one component of a comprehensive MRSA colonization surveillance program. It is not intended to diagnose MRSA infection nor to guide or monitor treatment for MRSA infections. Test performance is not FDA approved in patients less than 9 years old. Performed at [facility], [address] [place], [place] [address]          Radiology Studies: No results found.      Scheduled Meds:  bethanechol  10 mg Oral TID   Chlorhexidine Gluconate Cloth  6 each Topical Daily   enoxaparin (LOVENOX) injection  40 mg Subcutaneous Q24H   folic acid  1 mg Oral Daily   multivitamin with minerals  1 tablet Oral Daily   naltrexone  50 mg Oral Daily   mouth rinse  15 mL Mouth Rinse 4 times per day   phenobarbital  32.4 mg Oral Q8H   [START ON 03/04/2023] thiamine  100 mg Oral Daily   Or   [START ON 03/04/2023] thiamine  100 mg Intravenous Daily   Continuous Infusions:  sodium chloride Stopped (02/28/23 1046)     LOS: 6 days    Time spent: 45 minutes spent on chart review, discussion with nursing staff, consultants, updating family and interview/physical exam; more than 50% of that time was spent in counseling and/or coordination of  care.    Joseph Art, DO [facility] Available via Epic secure chat 7am-7pm After these hours, please refer to coverage provider listed on [email or web address] 03/03/2023, 10:58 AM

## 2023-03-03 NOTE — Consult Note (Signed)
[facility] Face-to-Face Psychiatry Consult   Reason for Consult:  Alcohol use Referring Physician:  Dr. Chestine Spore Patient Identification: Anthony [name] MRN:  [id number] Principal Diagnosis: AMS (altered mental status) Diagnosis:  Principal Problem:   AMS (altered mental status) Active Problems:   Alcoholism (HCC)   Substance induced mood disorder (HCC)   Total Time spent with patient: 30 minutes  Subjective:   Anthony [name] is a 57 y.o. male patient admitted with DT's. Patient is seen today after 3 day hospital stay of high acuity that resulted in ICU admission for precedex in setting of EtOH withdrawal. Psychiatry has been following intermittently as his withdrawal is medically treated.     HPI:  Anthony [name] is a 57 year old male, with a history MDD and alcoholism, admitted overnight seeking alcohol detox. Nursing staff notified this writer that patient has been increasingly medically unstable, confused, disoriented to self and situation, with persistent tremors for last few hours. Patient current CIWA score per nursing is 16, and he has responded poorly to antipsychotics and ativan., which were last given 1.5 hr ago. He initially presented to the [facility] but was transferred for reasons listed above. Thus a psychiatric consult was placed as he required an admission.    David Stall  During the course of the hospitalization patient is doing well at this tiime despite his persistent substance use. He denies any withdrawal symptoms and has some improved insight by seeking rehab following inpatient psychiatric hospitalization and appears motivated. He is focused on his returning to work, per wife. His mood is euthymic and he denies any SI, HI or AVH on multiple occasions throughout his stay. He is future oriented and is looking to attending rehab at long-term residential facility. While this patient presents with risk factors that history of substance abuse, treatment non-compliance and chronic poor  judgment, these are mitigated by protective factors which include no known access to weapons or firearms, presence of an available support system (his girlfriend) and patients agreement with treatment recommendations, safety plan and need for follow-up care at.    Based on my evaluation of the patient and patient's presentation, which includes Suicidal ideation, depression, and alcohol abuse issues, the patient's substance use and depressive symptoms appear to be severely negatively impacting the patient's ability to function in his activities of daily living at this time and is a danger to himself at this time. Thus, based on these factors, the patient meets criteria for inpatient dual diagnosis or inpatient rehab for psychiatric/substance abuse treatment at this time. He does meet criteria for IVC.     Updated wife of expanded bed search at this time. She is in agreement with ongoing inpaitent psych hospitalization.   Past Psychiatric History: Per patient only has a history of Alcohol use disorder. He denies any eating disorder, depression or anxiety disorder. He reports only taking thiamine only.   Risk to Self:   yes Risk to Others:   Denies Prior Inpatient Therapy:   Denies Prior Outpatient Therapy:   [facility]ss [facility]  Past Medical History:  Past Medical History:  Diagnosis Date   Alcohol withdrawal syndrome (HCC)    Alcoholism (HCC)    Anxiety    Depression    ED (erectile dysfunction)    GERD (gastroesophageal reflux disease)    Suicidal ideation    Tinnitus     Past Surgical History:  Procedure Laterality Date   LEG SURGERY Right    broken leg  ROTATOR CUFF REPAIR Bilateral    Family History:  Family History  Problem Relation Age of Onset   Diabetes Mother    Family Psychiatric  History: Mother- "she takes klonopin whatever that is used for. Father-GAD and Depression Social History:  Social History   Substance and Sexual Activity  Alcohol Use Yes    Comment: 3 per day     Social History   Substance and Sexual Activity  Drug Use Never    Social History   Socioeconomic History   Marital status: Divorced    Spouse name: Not on file   Number of children: Not on file   Years of education: Not on file   Highest education level: Not on file  Occupational History   Occupation: electrician  Tobacco Use   Smoking status: Never   Smokeless tobacco: Never  Vaping Use   Vaping status: Never Used  Substance and Sexual Activity   Alcohol use: Yes    Comment: 3 per day   Drug use: Never   Sexual activity: Not on file  Other Topics Concern   Not on file  Social History Narrative   Patient is married he has a son born in 2010.   He is an Personnel officer at [employer]   He has alcoholism and is currently drinking   No tobacco or drug use   Social Determinants of Corporate investment banker Strain: Not on file  Food Insecurity: Not on file  Transportation Needs: Not on file  Physical Activity: Not on file  Stress: Not on file  Social Connections: Unknown (11/19/2021)   [facility]   Social Network    Social Network: Not on file   Additional Social History:    Allergies:  No Known Allergies  Labs:  Results for orders placed or performed during the hospital encounter of 02/25/23 (from the past 48 hour(s))  Renal function panel     Status: Abnormal   Collection Time: 03/02/23  7:06 AM  Result Value Ref Range   Sodium 136 135 - 145 mmol/L   Potassium 3.4 (L) 3.5 - 5.1 mmol/L   Chloride 102 98 - 111 mmol/L   CO2 23 22 - 32 mmol/L   Glucose, Bld 87 70 - 99 mg/dL    Comment: Glucose reference range applies only to samples taken after fasting for at least 8 hours.   BUN 7 6 - 20 mg/dL   Creatinine, Ser 1.30 0.61 - 1.24 mg/dL   Calcium 9.0 8.9 - 86.5 mg/dL   Phosphorus 4.0 2.5 - 4.6 mg/dL   Albumin 2.9 (L) 3.5 - 5.0 g/dL   GFR, Estimated >78 >46 mL/min    Comment: (NOTE) Calculated using the CKD-EPI  Creatinine Equation (2021)    Anion gap 11 5 - 15    Comment: Performed at [facility], [address], [place], [place] [address]  Renal function panel     Status: Abnormal   Collection Time: 03/03/23 11:06 AM  Result Value Ref Range   Sodium 134 (L) 135 - 145 mmol/L   Potassium 3.7 3.5 - 5.1 mmol/L   Chloride 102 98 - 111 mmol/L   CO2 25 22 - 32 mmol/L   Glucose, Bld 89 70 - 99 mg/dL    Comment: Glucose reference range applies only to samples taken after fasting for at least 8 hours.   BUN 5 (L) 6 - 20 mg/dL   Creatinine, Ser 2.84 0.61 - 1.24 mg/dL  Calcium 9.2 8.9 - 10.3 mg/dL   Phosphorus 3.9 2.5 - 4.6 mg/dL   Albumin 3.3 (L) 3.5 - 5.0 g/dL   GFR, Estimated >16 >10 mL/min    Comment: (NOTE) Calculated using the CKD-EPI Creatinine Equation (2021)    Anion gap 7 5 - 15    Comment: Performed at [facility], [address], [place], [place] [address]    Current Facility-Administered Medications  Medication Dose Route Frequency Provider Last Rate Last Admin   0.9 %  sodium chloride infusion   Intravenous PRN Wynona Neat, Adewale A, MD   Stopped at 02/28/23 1046   bethanechol (URECHOLINE) tablet 10 mg  10 mg Oral TID Virl Diamond A, MD   10 mg at 03/03/23 1551   Chlorhexidine Gluconate Cloth 2 % PADS 6 each  6 each Topical Daily Lorin Glass, MD   6 each at 03/03/23 1003   docusate sodium (COLACE) capsule 100 mg  100 mg Oral BID PRN Minor, Vilinda Blanks, NP       enoxaparin (LOVENOX) injection 40 mg  40 mg Subcutaneous Q24H Karie Fetch P, DO   40 mg at 03/03/23 4098   folic acid (FOLVITE) tablet 1 mg  1 mg Oral Daily Bell, Lorin C, RPH   1 mg at 03/03/23 0950   LORazepam (ATIVAN) injection 2 mg  2 mg Intravenous Q6H PRN Marlin Canary U, DO   2 mg at 03/02/23 2333   losartan (COZAAR) tablet 25 mg  25 mg Oral Daily Marlin Canary U, DO   25 mg at 03/03/23 1157   multivitamin with minerals tablet 1 tablet  1 tablet Oral Daily Achille Rich, PA-C   1 tablet at 03/03/23  0950   naltrexone (DEPADE) tablet 50 mg  50 mg Oral Daily Cinderella, Margaret A   50 mg at 03/03/23 0950   Oral care mouth rinse  15 mL Mouth Rinse 4 times per day Karie Fetch P, DO   15 mL at 03/03/23 1610   Oral care mouth rinse  15 mL Mouth Rinse PRN Karie Fetch P, DO       PHENobarbital (LUMINAL) tablet 32.4 mg  32.4 mg Oral Q8H Marrianne Mood, MD   32.4 mg at 03/03/23 1552   polyethylene glycol (MIRALAX / GLYCOLAX) packet 17 g  17 g Oral Daily PRN Minor, Vilinda Blanks, NP       [name] ON 03/04/2023] thiamine (VITAMIN B1) tablet 100 mg  100 mg Oral Daily Knute Neu, [facility]       Or   [START ON 03/04/2023] thiamine (VITAMIN B1) injection 100 mg  100 mg Intravenous Daily Knute Neu, [facility]        Musculoskeletal: Strength & Muscle Tone: within normal limits Gait & Station: normal Patient leans: N/A            Psychiatric Specialty Exam:  Presentation  General Appearance:  Appropriate for Environment; Casual  Eye Contact: Fair  Speech: Clear and Coherent; Normal Rate  Speech Volume: Normal  Handedness: Right   Mood and Affect  Mood: Anxious  Affect: Appropriate; Congruent   Thought Process  Thought Processes: Coherent; Linear  Descriptions of Associations:Intact  Orientation:Full (Time, Place and Person)  Thought Content:Logical  History of Schizophrenia/Schizoaffective disorder:No  Duration of Psychotic Symptoms:No data recorded Hallucinations:Hallucinations: None   Ideas of Reference:None  Suicidal Thoughts:Suicidal Thoughts: No   Homicidal Thoughts:Homicidal Thoughts: No    Sensorium  Memory: Immediate Fair; Recent Fair; Remote Fair  Judgment: Fair  Insight: Fair   Chartered certified accountant: Fair  Attention Span: Good  Recall: Dudley Major of Knowledge: Fair  Language: Fair   Psychomotor Activity  Psychomotor Activity: Psychomotor Activity: Normal    Assets  Assets: Manufacturing systems engineer;  Desire for Improvement; Financial Resources/Insurance; Social Support   Sleep  Sleep: Sleep: Fair    Physical Exam: Physical Exam Vitals and nursing note reviewed.  Constitutional:      Appearance: Normal appearance. He is normal weight.  Neurological:     General: No focal deficit present.     Mental Status: He is alert and oriented to person, place, and time. Mental status is at baseline.  Psychiatric:        Attention and Perception: Perception normal. He is inattentive.        Mood and Affect: Affect normal. Mood is anxious.        Speech: Speech normal.        Behavior: Behavior normal. Behavior is cooperative.        Thought Content: Thought content normal.        Cognition and Memory: Cognition and memory normal.        Judgment: Judgment is impulsive.    Review of Systems  Neurological:  Positive for tremors.  Psychiatric/Behavioral:  Positive for depression and substance abuse (etoh). The patient is nervous/anxious and has insomnia.   All other systems reviewed and are negative.  Blood pressure 132/85, pulse 69, temperature 98.2 F (36.8 C), temperature source Oral, resp. rate 18, weight 71.4 kg, SpO2 98%. Body mass index is 23.25 kg/m.  Treatment Plan Summary: Daily contact with patient to assess and evaluate symptoms and progress in treatment, Medication management, and Plan    -Completed IV thiamine 250 daily today -Completed Phenobarb taper, one more day remaining -TOC referral for inpatient psych once medically stable. - At this time patient is willing to go voluntary, in the event he attempts to leave will need to be placed under IVC. He is a moderate to high risk suicide attempt, with limited non-modifiable risk factors.  -Will start Wellbutrin 75mg  po daily for depression and anxiety. -Will continue Naltrexone 50mg  po daily for AUD. He reports speaking with the psychiatrist this weekend and side effect reviewed.   Thiamine 343, albumin 2.9  Psychiatry  will continue to monitor.   Disposition: Recommend psychiatric Inpatient admission when medically cleared.  Maryagnes Amos, FNP 03/03/2023 4:24 PM

## 2023-03-04 ENCOUNTER — Telehealth (HOSPITAL_COMMUNITY): Payer: Self-pay | Admitting: Licensed Clinical Social Worker

## 2023-03-04 ENCOUNTER — Inpatient Hospital Stay (HOSPITAL_COMMUNITY)
Admission: AD | Admit: 2023-03-04 | Discharge: 2023-03-08 | DRG: 885 | Disposition: A | Discharge status: Home / Self Care | Payer: 59 | Source: Intra-hospital | Attending: Psychiatry | Admitting: Psychiatry

## 2023-03-04 ENCOUNTER — Other Ambulatory Visit: Payer: Self-pay

## 2023-03-04 ENCOUNTER — Encounter (HOSPITAL_COMMUNITY): Payer: Self-pay | Admitting: Family

## 2023-03-04 DIAGNOSIS — R000 Tachycardia, unspecified: Secondary | ICD-10-CM | POA: Diagnosis present

## 2023-03-04 DIAGNOSIS — F411 Generalized anxiety disorder: Secondary | ICD-10-CM | POA: Diagnosis present

## 2023-03-04 DIAGNOSIS — Z833 Family history of diabetes mellitus: Secondary | ICD-10-CM

## 2023-03-04 DIAGNOSIS — Z818 Family history of other mental and behavioral disorders: Secondary | ICD-10-CM

## 2023-03-04 DIAGNOSIS — F1020 Alcohol dependence, uncomplicated: Secondary | ICD-10-CM | POA: Diagnosis present

## 2023-03-04 DIAGNOSIS — F10931 Alcohol use, unspecified with withdrawal delirium: Secondary | ICD-10-CM | POA: Diagnosis not present

## 2023-03-04 DIAGNOSIS — Z62810 Personal history of physical and sexual abuse in childhood: Secondary | ICD-10-CM

## 2023-03-04 DIAGNOSIS — F332 Major depressive disorder, recurrent severe without psychotic features: Principal | ICD-10-CM | POA: Diagnosis present

## 2023-03-04 DIAGNOSIS — Z91199 Patient's noncompliance with other medical treatment and regimen due to unspecified reason: Secondary | ICD-10-CM

## 2023-03-04 DIAGNOSIS — G4700 Insomnia, unspecified: Secondary | ICD-10-CM | POA: Diagnosis present

## 2023-03-04 DIAGNOSIS — I10 Essential (primary) hypertension: Secondary | ICD-10-CM | POA: Diagnosis present

## 2023-03-04 DIAGNOSIS — Z79899 Other long term (current) drug therapy: Secondary | ICD-10-CM | POA: Diagnosis not present

## 2023-03-04 DIAGNOSIS — E519 Thiamine deficiency, unspecified: Secondary | ICD-10-CM | POA: Diagnosis present

## 2023-03-04 LAB — RENAL FUNCTION PANEL
Albumin: 2.9 g/dL — ABNORMAL LOW (ref 3.5–5.0)
Anion gap: 12 (ref 5–15)
BUN: 6 mg/dL (ref 6–20)
CO2: 21 mmol/L — ABNORMAL LOW (ref 22–32)
Calcium: 9 mg/dL (ref 8.9–10.3)
Chloride: 105 mmol/L (ref 98–111)
Creatinine, Ser: 0.78 mg/dL (ref 0.61–1.24)
GFR, Estimated: >60 mL/min (ref >60)
Glucose, Bld: 83 mg/dL (ref 70–99)
Phosphorus: 4 mg/dL (ref 2.5–4.6)
Potassium: 3.8 mmol/L (ref 3.5–5.1)
Sodium: 138 mmol/L (ref 135–145)

## 2023-03-04 MED ORDER — DIPHENHYDRAMINE HCL 50 MG/ML IJ SOLN: 50.0000 mg | Freq: Three times a day (TID) | INTRAMUSCULAR | Status: DC | PRN

## 2023-03-04 MED ORDER — LOSARTAN POTASSIUM 25 MG PO TABS: 25.0000 mg | ORAL_TABLET | Freq: Every day | ORAL | Status: AC

## 2023-03-04 MED ORDER — FOLIC ACID 1 MG PO TABS: 1.0000 mg | ORAL_TABLET | Freq: Every day | ORAL | Status: AC

## 2023-03-04 MED ORDER — MAGNESIUM HYDROXIDE 400 MG/5ML PO SUSP: 30.0000 mL | Freq: Every day | ORAL | Status: DC | PRN

## 2023-03-04 MED ORDER — ADULT MULTIVITAMIN W/MINERALS CH
1.0000 | ORAL_TABLET | Freq: Every day | ORAL | Status: DC
  Administered 2023-03-05 – 2023-03-08 (×4): 1 via ORAL
  Filled 2023-03-04 (×6): qty 1

## 2023-03-04 MED ORDER — TRAZODONE HCL 50 MG PO TABS
50.0000 mg | ORAL_TABLET | Freq: Every evening | ORAL | Status: DC | PRN
  Administered 2023-03-04 – 2023-03-05 (×2): 50 mg via ORAL
  Filled 2023-03-04 (×3): qty 1

## 2023-03-04 MED ORDER — HALOPERIDOL 5 MG PO TABS: 5.0000 mg | ORAL_TABLET | Freq: Three times a day (TID) | ORAL | Status: DC | PRN

## 2023-03-04 MED ORDER — NALTREXONE HCL 50 MG PO TABS
50.0000 mg | ORAL_TABLET | Freq: Every day | ORAL | Status: DC
  Administered 2023-03-05 – 2023-03-08 (×4): 50 mg via ORAL
  Filled 2023-03-04 (×6): qty 1

## 2023-03-04 MED ORDER — VITAMIN B-1 100 MG PO TABS
100.0000 mg | ORAL_TABLET | Freq: Every day | ORAL | Status: DC
  Administered 2023-03-05 – 2023-03-08 (×4): 100 mg via ORAL
  Filled 2023-03-04 (×6): qty 1

## 2023-03-04 MED ORDER — VITAMIN B-1 100 MG PO TABS: 100.0000 mg | ORAL_TABLET | Freq: Every day | ORAL | Status: DC

## 2023-03-04 MED ORDER — DIPHENHYDRAMINE HCL 25 MG PO CAPS: 50.0000 mg | ORAL_CAPSULE | Freq: Three times a day (TID) | ORAL | Status: DC | PRN

## 2023-03-04 MED ORDER — ENSURE ENLIVE PO LIQD
237.0000 mL | Freq: Two times a day (BID) | ORAL | Status: DC
  Filled 2023-03-04 (×11): qty 237

## 2023-03-04 MED ORDER — LORAZEPAM 2 MG/ML IJ SOLN: 2.0000 mg | Freq: Three times a day (TID) | INTRAMUSCULAR | Status: DC | PRN

## 2023-03-04 MED ORDER — DOCUSATE SODIUM 100 MG PO CAPS: 100.0000 mg | ORAL_CAPSULE | Freq: Two times a day (BID) | ORAL | Status: DC | PRN

## 2023-03-04 MED ORDER — BETHANECHOL CHLORIDE 10 MG PO TABS
10.0000 mg | ORAL_TABLET | Freq: Three times a day (TID) | ORAL | Status: AC
  Filled 2023-03-04: qty 1

## 2023-03-04 MED ORDER — PHENOBARBITAL 32.4 MG PO TABS: 32.4000 mg | ORAL_TABLET | Freq: Three times a day (TID) | ORAL | Status: DC

## 2023-03-04 MED ORDER — FOLIC ACID 1 MG PO TABS
1.0000 mg | ORAL_TABLET | Freq: Every day | ORAL | Status: DC
  Administered 2023-03-05 – 2023-03-08 (×4): 1 mg via ORAL
  Filled 2023-03-04 (×6): qty 1

## 2023-03-04 MED ORDER — POLYETHYLENE GLYCOL 3350 17 G PO PACK: 17.0000 g | PACK | Freq: Every day | ORAL | Status: DC | PRN

## 2023-03-04 MED ORDER — ACETAMINOPHEN 325 MG PO TABS: 650.0000 mg | ORAL_TABLET | Freq: Four times a day (QID) | ORAL | Status: DC | PRN

## 2023-03-04 MED ORDER — BUPROPION HCL 75 MG PO TABS: 75.0000 mg | ORAL_TABLET | Freq: Every day | ORAL | Status: DC

## 2023-03-04 MED ORDER — BUPROPION HCL 75 MG PO TABS
75.0000 mg | ORAL_TABLET | Freq: Every day | ORAL | Status: DC
  Administered 2023-03-05: 75 mg via ORAL
  Filled 2023-03-04 (×2): qty 1

## 2023-03-04 MED ORDER — HYDROXYZINE HCL 25 MG PO TABS
25.0000 mg | ORAL_TABLET | ORAL | Status: AC
  Administered 2023-03-04: 25 mg via ORAL
  Filled 2023-03-04 (×2): qty 1

## 2023-03-04 MED ORDER — ALUM & MAG HYDROXIDE-SIMETH 200-200-20 MG/5ML PO SUSP: 30.0000 mL | ORAL | Status: DC | PRN

## 2023-03-04 MED ORDER — HALOPERIDOL LACTATE 5 MG/ML IJ SOLN: 5.0000 mg | Freq: Three times a day (TID) | INTRAMUSCULAR | Status: DC | PRN

## 2023-03-04 MED ORDER — NALTREXONE HCL 50 MG PO TABS: 50.0000 mg | ORAL_TABLET | Freq: Every day | ORAL | Status: DC

## 2023-03-04 MED ORDER — LOSARTAN POTASSIUM 25 MG PO TABS
25.0000 mg | ORAL_TABLET | Freq: Every day | ORAL | Status: DC
  Administered 2023-03-05 – 2023-03-08 (×4): 25 mg via ORAL
  Filled 2023-03-04 (×6): qty 1

## 2023-03-04 MED ORDER — PHENOBARBITAL 32.4 MG PO TABS
32.4000 mg | ORAL_TABLET | Freq: Three times a day (TID) | ORAL | Status: AC
  Administered 2023-03-04: 32.4 mg via ORAL
  Filled 2023-03-04: qty 1

## 2023-03-04 MED ORDER — LORAZEPAM 1 MG PO TABS: 2.0000 mg | ORAL_TABLET | Freq: Three times a day (TID) | ORAL | Status: DC | PRN

## 2023-03-04 NOTE — Tx Team (Signed)
Initial Treatment Plan 03/04/2023 6:50 PM Anthony [name] OZD:[id number]    PATIENT STRESSORS: Substance abuse     PATIENT STRENGTHS: Average or above average intelligence  Capable of independent living  Communication skills  General fund of knowledge  Motivation for treatment/growth    PATIENT IDENTIFIED PROBLEMS: "This was my wife's suggestion."  "I tried detox and residential treatment and it helped but then always slipped up."  "Need to treat the underlying issue."                 DISCHARGE CRITERIA:  Improved stabilization in mood, thinking, and/or behavior Motivation to continue treatment in a less acute level of care  PRELIMINARY DISCHARGE PLAN: Outpatient therapy  PATIENT/FAMILY INVOLVEMENT: This treatment plan has been presented to and reviewed with the patient, Anthony [name]. The patient has been given the opportunity to ask questions and make suggestions.  Karn Pickler, RN 03/04/2023, 6:50 PM

## 2023-03-04 NOTE — BHH Group Notes (Signed)
Adult Psychoeducational Group Note  Date:  03/04/2023 Time:  8:46 PM  Group Topic/Focus:  Wrap-Up Group:   The focus of this group is to help patients review their daily goal of treatment and discuss progress on daily workbooks.  Participation Level:  Did Not Attend  Participation Quality:      Affect:  Appropriate  Cognitive:  Appropriate  Insight: None  Engagement in Group:    Modes of Intervention:    Additional Comments:  Pt did not attend group.  Joselyn Arrow 03/04/2023, 8:46 PM

## 2023-03-04 NOTE — Progress Notes (Signed)
03/04/2023  Anthony [name] DOB: [birth year] MRN: [id number]   RIDER WAIVER AND RELEASE OF LIABILITY  For the purposes of helping with transportation needs, [place] partners with outside transportation providers (taxi companies, [place], Catering manager.) to give [employer] patients or other approved people the choice of on-demand rides [employer]") to our buildings for non-emergency visits.  By using [employer], I, the person signing this document, on behalf of myself and/or any legal minors (in my care using the [employer]), agree:  Science writer given to me are supplied by independent, outside transportation providers who do not work for, or have any affiliation with, [employer]. [place] is not a transportation company. [place] has no control over the quality or safety of the rides I get using [employer]. [place] has no control over whether any outside ride will happen on time or not. [place] gives no guarantee on the reliability, quality, safety, or availability on any rides, or that no mistakes will happen. I know and accept that traveling by vehicle (car, truck, SVU, Zenaida Niece, bus, taxi, etc.) has risks of serious injuries such as disability, being paralyzed, and death. I know and agree the risk of using [employer] is mine alone, and not [employer]. Transport Services are provided "as is" and as are available. The transportation providers are in charge for all inspections and care of the vehicles used to provide these rides. I agree not to take legal action against [employer], its agents, employees, officers, directors, representatives, insurers, attorneys, assigns, successors, subsidiaries, and affiliates at any time for any reasons related directly or indirectly to using [employer]. I also agree not to take legal action against [place] or its affiliates for any injury, death, or damage to property caused by or related to using  [employer]. I have read this Waiver and Release of Liability, and I understand the terms used in it and their legal meaning. This Waiver is freely and voluntarily given with the understanding that my right (or any legal minors) to legal action against [place] relating to [employer] is knowingly given up to use these services.   I attest that I read the Ride Waiver and Release of Liability to Anthony [name], gave Mr. Kerstein the opportunity to ask questions and answered the questions asked (if any). I affirm that Anthony [name] then provided consent for assistance with transportation.

## 2023-03-04 NOTE — Plan of Care (Signed)
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
## 2023-03-04 NOTE — Progress Notes (Signed)
   03/04/23 2156  Psych Admission Type (Psych Patients Only)  Admission Status Voluntary  Psychosocial Assessment  Patient Complaints Anxiety;Substance abuse;Worrying  Eye Contact Brief  Facial Expression Flat;Anxious;Sad  Affect Apprehensive  Speech Logical/coherent  Interaction Minimal;Guarded  Motor Activity Fidgety;Pacing;Restless  Appearance/Hygiene Unremarkable  Behavior Characteristics Cooperative  Mood Depressed;Anxious  Thought Process  Coherency WDL  Content WDL  Delusions None reported or observed  Perception WDL  Hallucination None reported or observed  Judgment Impaired  Confusion None  Danger to Self  Current suicidal ideation? Denies  Danger to Others  Danger to Others None reported or observed

## 2023-03-04 NOTE — Progress Notes (Signed)
Attempted to call report to Inpatient Psych, waiting on return  phone call.

## 2023-03-04 NOTE — Progress Notes (Signed)
LCSW Progress Note  [id number]   Anthony [name]  03/04/2023  2:20 AM    Inpatient Behavioral Health Placement  Pt meets inpatient criteria per Maryagnes Amos, FNP. Referral was sent to the following facilities;   Destination  Service Provider Address Phone Fax  [facility] [place] [address] [phone]  [facility]al [facility]  [place] [phone]  [facility]  [address] [place] [address] [phone]  [facility] EFAX  [address] [place]e [place] [phone]  [facility]  [address] [place] [address] [phone]  [facility]  [address], [place] [address] [phone]  [facility]  [address], [place] [address] [phone]  [facility]  [place] [phone]  [facility]  [address], [place] [address] [phone]  [facility]  [address] [place] [address] [phone]  [facility]  [address], [place] [address] [phone]  [facility]  [address] [place] [address] [phone]  [facility] Patient Placement  [facility], [place] [phone]  [facility]  [address] [place] [address] [phone]  [facility]  [address] [place] [address] [phone]  [facility] [address]  [address] [place] [address] [phone]  [facility]  [address] [place], [place] [address] [phone]  [facility]  [address] [place] [address] [phone]  [facility]  [address] [place], [place] [address] [phone]  [facility]  [address] [place] [address] [phone]  [facility] ([facility])  [address] [place] [address] [phone]  [facility]  [address] [place] [address] [phone]  [facility]  [address] [place] [address] [phone]  [facility]  [address] [place] [address] [phone]  [facility]  [address] [place] [address] [phone]  [facility] [address]  [address], [place] [address] [phone]   [facility]  [address] [place] [address] [phone]  [facility]  [address], [place] [address] [phone]  [facility]  [address], [place] [address] [phone]  [facility]  [address] [place] [address] [phone]  [facility]  [address], [place] [address] [phone]  [facility]  [address], [place] [address] [phone]  [facility]  [address], [place] [address] [phone]  [facility] [place] [phone]  [facility]  [address] [place] [address] [phone]  [facility]  [address] [place] [address] [phone]  [facility]  [address] [place] [address]  [phone]  [facility]  [address] [place] [address] [phone]  [facility]  [address] [place] [address] [phone]  [facility]  [address] [place] [address] [phone]  [facility]  [address] [place] [address] [phone]  [facility]  [address] [place], [place] [address] [phone]    Situation ongoing,  CSW will follow up.    Maryjean Ka, MSW, LCSWA 03/04/2023 2:20 AM

## 2023-03-04 NOTE — TOC Progression Note (Signed)
Transition of Care [facility]) - Progression Note    Patient Details  Name: OBAMA GARNO MRN: [id number] Date of Birth: [birth year]  Transition of Care [facility]) CM/SW Contact  Tanner Yeley A [place], [place] Phone Number: 03/04/2023, 10:51 AM  Clinical Narrative:     Bed offers still pending for inpatient psych.   TOC will continue to follow.   Expected Discharge Plan: Home/Self Care Barriers to Discharge: Continued Medical Work up  Expected Discharge Plan and Services In-house Referral: Clinical Social Work     Living arrangements for the past 2 months: Single Family Home                                       Social Determinants of Health (SDOH) Interventions SDOH Screenings   Food Insecurity: No Food Insecurity (03/03/2023)  Housing: Low Risk  (03/03/2023)  Transportation Needs: No Transportation Needs (03/03/2023)  Utilities: Not At Risk (03/03/2023)  Depression (PHQ2-9): Medium Risk (01/12/2023)  Social Connections: Unknown (11/19/2021)   Received from [facility]  Tobacco Use: Low Risk  (03/03/2023)    Readmission Risk Interventions     No data to display

## 2023-03-04 NOTE — Telephone Encounter (Signed)
The therapist receives an email from [place] with a ROI and a Disability Provider Form to complete. The therapist calls [place] speaking with Dow Adolph and informing her that Karmell was admitted to ICU for DTs and is in the process of being sent to an inpatient psychiatric facility.  Myrna Blazer, MA, LCSW, [employer], LCAS 03/04/2023

## 2023-03-04 NOTE — Progress Notes (Signed)
Pt has been accepted to [facility], 03/04/23, pending voluntary consent.  Bed assignment: 400-2  Pt meets inpatient criteria per Caryn Bee, NP  Attending Physician will be Phineas Inches, MD   Report can be called to: Adult unit: [phone]  Pt can arrive after, pending items received.  Care Team Notified: Roseanne Reno, RN, Rona Ravens, RN, Kiva [place], LCSWA, Aubery Lapping, RN and Caryn Bee, FNP.  Cathie Beams, LCSW  03/04/2023 11:51 AM

## 2023-03-04 NOTE — Progress Notes (Signed)
Report called to [place] at [facility]- inpatient.

## 2023-03-04 NOTE — Progress Notes (Signed)
Progress Note   Patient: Anthony [name] YNW:[id number] DOB: 03-10-1966 DOA: 02/25/2023     7 DOS: the patient was seen and examined on 03/04/2023   Subjective:  Patient seen and examined at bedside this morning Denies nausea vomiting abdominal pain chest pain or cough Currently awaiting inpatient psych Plan of care discussed with transition of care manager  Brief Narrative:  57 year old with long history of EtOH abuse coupled with anxiety, depression, suicidal ideation and major depression. Originally presents to behavioral health center for alcohol withdrawal but due to tachycardia and hallucinations was transferred to [facility] emergency department. He been heavily sedated with Ativan.  Patient is currently medically ready awaiting inpatient psych admission.   Assessment and Plan: EtOH withdrawal Metabolic encephalopathy- resolved -Continue on phenobarbital taper -Patient has been weaned off Precedex -Continue multivitamins, thiamine   History of depression Psychiatry on board and case discussed -For inpatient psych once medically stable, psych to continue to assist - TOC consulted   Hypertension -Was on losartan outpatient-- will resume dose   Hematuria-secondary traumatic Foley insertion Foley removed Resolved  Hyponatremia, hypokalemia -Continue repletion and monitoring     DVT prophylaxis: enoxaparin (LOVENOX) injection 40 mg Start: 02/26/23 1015 SCDs Start: 02/25/23 0941     Code Status: Full Code  Family Communication: No family at bedside today   Disposition Plan:  Level of care: Med-Surg Status is: Inpatient Remains inpatient appropriate     Consultants:  Psych     Physical Exam:  General: Appearance:  Well-developed in no acute distress        Lungs:   Normal breath sounds unlabored  Heart:    Normal heart rate.   MS:   All extremities are intact.   Neurologic:   Awake, alert, irritable   Vitals:   03/03/23 2248 03/04/23 0407 03/04/23 0500  03/04/23 0734  BP:  122/72  (!) 144/92  Pulse:  68  79  Resp:    18  Temp:    (!) 97.5 F (36.4 C)  TempSrc:    Oral  SpO2:  93%  100%  Weight:   69.9 kg   Height: 5\' 9"  (1.753 m)       Data Reviewed: Have reviewed previous documentation by previous provider, psychiatrist documentation, transition of care manager documentation I have also reviewed patient's above vitals as well as CBC and BMP    Latest Ref Rng & Units 02/26/2023    6:34 AM 02/25/2023    8:52 AM 02/20/2023    9:15 PM  CBC  WBC 4.0 - 10.5 K/uL 11.4  5.6  6.7   Hemoglobin 13.0 - 17.0 g/dL 65.7  84.6  96.2   Hematocrit 39.0 - 52.0 % 40.1  36.0  44.0   Platelets 150 - 400 K/uL 184  230  315        Latest Ref Rng & Units 03/04/2023    5:54 AM 03/03/2023   11:06 AM 03/02/2023    7:06 AM  BMP  Glucose 70 - 99 mg/dL 83  89  87   BUN 6 - 20 mg/dL 6  5  7    Creatinine 0.61 - 1.24 mg/dL 9.52  8.41  3.24   Sodium 135 - 145 mmol/L 138  134  136   Potassium 3.5 - 5.1 mmol/L 3.8  3.7  3.4   Chloride 98 - 111 mmol/L 105  102  102   CO2 22 - 32 mmol/L 21  25  23    Calcium 8.9 -  10.3 mg/dL 9.0  9.2  9.0     Author: Loyce Dys, MD 03/04/2023 2:36 PM  For on call review [email or web address].

## 2023-03-04 NOTE — Telephone Encounter (Signed)
The therapist returns Bill's wife's call. She was calling about who would fill out her FMLA paperwork; however, she says that she has gotten this resolved and that [name] is going to [facility].  Myrna Blazer, MA, LCSW, [facility], LCAS 03/04/2023

## 2023-03-04 NOTE — Discharge Summary (Signed)
Physician Discharge Summary   Patient: Anthony [name] MRN: [id number] DOB: 09-07-1965  Admit date:     02/25/2023  Discharge date: 03/04/23  Discharge Physician: Loyce Dys   PCP: Patient, No Pcp Per   Recommendations at discharge:  Follow-up with psychiatry  Discharge Diagnoses: EtOH withdrawal Metabolic encephalopathy- resolved History of depression Hypertension Hematuria-secondary traumatic Foley insertion-resolved Hyponatremia, hypokalemia  Hospital Course: 57 year old with long history of EtOH abuse coupled with anxiety, depression, suicidal ideation and major depression. Originally presents to behavioral health center for alcohol withdrawal but due to tachycardia and hallucinations was transferred to [facility] emergency department.  Patient was initially managed with Ativan and then Precedex drip which has now been weaned off.  Patient has been refused for psych and has been deemed to meet inpatient psych criteria.  Patient is therefore being discharged to inpatient psych.  Consultants: Psychiatry Procedures performed: None Disposition: Rehabilitation facility Diet recommendation:  Cardiac diet DISCHARGE MEDICATION: Allergies as of 03/04/2023   No Known Allergies      Medication List     STOP taking these medications    baclofen 10 MG tablet Commonly known as: LIORESAL   buPROPion ER 100 MG 12 hr tablet Commonly known as: Wellbutrin SR Replaced by: buPROPion 75 MG tablet   risperiDONE 1 MG tablet Commonly known as: RISPERDAL       TAKE these medications    buPROPion 75 MG tablet Commonly known as: WELLBUTRIN Take 1 tablet (75 mg total) by mouth daily. Start taking on: March 05, 2023 Replaces: buPROPion ER 100 MG 12 hr tablet   docusate sodium 100 MG capsule Commonly known as: COLACE Take 1 capsule (100 mg total) by mouth 2 (two) times daily as needed for mild constipation.   folic acid 1 MG tablet Commonly known as: FOLVITE Take 1 tablet (1 mg  total) by mouth daily. Start taking on: March 05, 2023   losartan 25 MG tablet Commonly known as: COZAAR Take 1 tablet (25 mg total) by mouth daily. Start taking on: March 05, 2023   naltrexone 50 MG tablet Commonly known as: DEPADE Take 1 tablet (50 mg total) by mouth daily. Start taking on: March 05, 2023   PHENobarbital 32.4 MG tablet Commonly known as: LUMINAL Take 1 tablet (32.4 mg total) by mouth every 8 (eight) hours.   polyethylene glycol 17 g packet Commonly known as: MIRALAX / GLYCOLAX Take 17 g by mouth daily as needed for moderate constipation.   thiamine 100 MG tablet Commonly known as: VITAMIN B1 Take 100 mg by mouth every morning.   thiamine 100 MG tablet Commonly known as: Vitamin B-1 Take 1 tablet (100 mg total) by mouth daily. Start taking on: March 05, 2023        Discharge Exam: Ceasar Mons Weights   03/01/23 0500 03/02/23 0454 03/04/23 0500  Weight: 70.2 kg 71.4 kg 69.9 kg   eneral: Appearance:    Well developed, well nourished male in no acute distress       Lungs:     Clear to auscultation bilaterally, respirations unlabored  Heart:    Normal heart rate.   MS:   All extremities are intact.   Neurologic:   Awake, alert, irritable    Condition at discharge: good  The results of significant diagnostics from this hospitalization (including imaging, microbiology, ancillary and laboratory) are listed below for reference.   Imaging Studies: US Abdomen Limited RUQ (LIVER/GB)  Result Date: 02/25/2023 CLINICAL DATA:  Transaminitis EXAM: ULTRASOUND ABDOMEN LIMITED  RIGHT UPPER QUADRANT COMPARISON:  CT abdomen pelvis 02/09/2005 FINDINGS: Gallbladder: No gallstones or wall thickening visualized. No sonographic Murphy sign noted by sonographer. Common bile duct: Diameter: 3.7 mm Liver: Increased echogenicity. No focal lesion. Portal vein is patent on color Doppler imaging with normal direction of blood flow towards the liver. Other: None. IMPRESSION: 1.  Increased hepatic parenchymal echogenicity suggestive of steatosis. 2. No cholelithiasis or sonographic evidence for acute cholecystitis. Electronically Signed   By: Annia Belt M.D.   On: 02/25/2023 15:41   CT Head Wo Contrast  Result Date: 02/25/2023 CLINICAL DATA:  Mental status change, unknown cause EXAM: CT HEAD WITHOUT CONTRAST TECHNIQUE: Contiguous axial images were obtained from the base of the skull through the vertex without intravenous contrast. RADIATION DOSE REDUCTION: This exam was performed according to the departmental dose-optimization program which includes automated exposure control, adjustment of the mA and/or kV according to patient size and/or use of iterative reconstruction technique. COMPARISON:  None Available. FINDINGS: Brain: No evidence of acute infarction, hemorrhage, hydrocephalus, extra-axial collection or mass lesion/mass effect. Vascular: No hyperdense vessel or unexpected calcification. Skull: Normal. Negative for fracture or focal lesion. Sinuses/Orbits: No middle ear or mastoid effusion. Mucosal thickening right maxillary sinus. Orbits are unremarkable Other: None. IMPRESSION: No acute intracranial abnormality. Electronically Signed   By: Lorenza Cambridge M.D.   On: 02/25/2023 13:35   EEG adult  Result Date: 02/25/2023 Charlsie Quest, MD     02/25/2023  1:59 PM Patient Name: Anthony [name] MRN: [id number] Epilepsy Attending: Charlsie Quest Referring Physician/Provider: Judy Pimple Vilinda Blanks, NP Date: 02/25/2023 Duration: 23.11 mins Patient history: 57yo m with ams getting eeg to evaluate for seizure Level of alertness: asleep/lethargic AEDs during EEG study: Phenobarb Technical aspects: This EEG study was done with scalp electrodes positioned according to the 10-20 International system of electrode placement. Electrical activity was reviewed with band pass filter of 1-70Hz , sensitivity of 7 uV/mm, display speed of 70mm/sec with a 60Hz  notched filter applied as appropriate. EEG  data were recorded continuously and digitally stored.  Video monitoring was available and reviewed as appropriate. Description: EEG showed continuous generalized 3 to 5 Hz theta-delta slowing admixed with 13-15hz  beta activity. Hyperventilation and photic stimulation were not performed.   ABNORMALITY - Continuous slow, generalized IMPRESSION: This study is suggestive of moderate to severe diffuse encephalopathy, nonspecific etiology but likely related to benzodiazepine use. No seizures or epileptiform discharges were seen throughout the recording. Charlsie Quest   DG Chest Port 1 View  Result Date: 02/25/2023 CLINICAL DATA:  Hypoxia EXAM: PORTABLE CHEST 1 VIEW COMPARISON:  Report of the chest x-ray of August 2006 FINDINGS: No consolidation, pneumothorax or effusion. No edema. Normal cardiopericardial silhouette. Overlapping cardiac leads. IMPRESSION: No acute cardiopulmonary disease. Electronically Signed   By: Karen Kays M.D.   On: 02/25/2023 10:10    Microbiology: Results for orders placed or performed during the hospital encounter of 02/25/23  MRSA Next Gen by PCR, Nasal     Status: None   Collection Time: 02/25/23 12:48 PM   Specimen: Nasal Mucosa; Nasal Swab  Result Value Ref Range Status   MRSA by PCR Next Gen NOT DETECTED NOT DETECTED Final    Comment: (NOTE) The GeneXpert MRSA Assay (FDA approved for NASAL specimens only), is one component of a comprehensive MRSA colonization surveillance program. It is not intended to diagnose MRSA infection nor to guide or monitor treatment for MRSA infections. Test performance is not FDA approved in patients less than 2  years old. Performed at [facility], [address] [place], [place] [address]     Labs: CBC: Recent Labs  Lab 02/26/23 0634  WBC 11.4*  HGB 13.7  HCT 40.1  MCV 87.9  PLT 184   Basic Metabolic Panel: Recent Labs  Lab 02/28/23 0453 03/01/23 0552 03/02/23 0706 03/03/23 1106 03/04/23 0554  NA 131* 136  136 134* 138  K 2.9* 3.2* 3.4* 3.7 3.8  CL 95* 99 102 102 105  CO2 23 23 23 25  21*  GLUCOSE 80 115* 87 89 83  BUN 7 8 7  5* 6  CREATININE 0.72 0.75 0.68 0.71 0.78  CALCIUM 8.9 9.0 9.0 9.2 9.0  MG  --  1.9  --   --   --   PHOS 3.5 3.7 4.0 3.9 4.0   Liver Function Tests: Recent Labs  Lab 02/28/23 0453 03/01/23 0552 03/02/23 0706 03/03/23 1106 03/04/23 0554  ALBUMIN 3.0* 2.9* 2.9* 3.3* 2.9*   CBG: Recent Labs  Lab 02/26/23 1950 02/26/23 2330 02/27/23 0329 02/27/23 0727 02/27/23 1117  GLUCAP 101* 90 85 101* 83    Discharge time spent:  35 minutes.  Signed: Loyce Dys, MD [facility] 03/04/2023

## 2023-03-05 ENCOUNTER — Other Ambulatory Visit: Payer: Self-pay | Admitting: Behavioral Health

## 2023-03-05 DIAGNOSIS — F332 Major depressive disorder, recurrent severe without psychotic features: Secondary | ICD-10-CM | POA: Diagnosis not present

## 2023-03-05 LAB — LIPID PANEL
Cholesterol: 183 mg/dL (ref 0–200)
HDL: 70 mg/dL (ref >40)
LDL Cholesterol: 102 mg/dL — ABNORMAL HIGH (ref 0–99)
Total CHOL/HDL Ratio: 2.6 RATIO
Triglycerides: 54 mg/dL (ref <150)
VLDL: 11 mg/dL (ref 0–40)

## 2023-03-05 LAB — HEMOGLOBIN A1C
Hgb A1c MFr Bld: 5.3 % (ref 4.8–5.6)
Mean Plasma Glucose: 105.41 mg/dL

## 2023-03-05 MED ORDER — HYDROXYZINE HCL 25 MG PO TABS
25.0000 mg | ORAL_TABLET | Freq: Three times a day (TID) | ORAL | Status: DC | PRN
  Administered 2023-03-05: 25 mg via ORAL
  Filled 2023-03-05 (×2): qty 1
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
## 2023-03-05 NOTE — Progress Notes (Signed)
Patient ID: Anthony [name], male   DOB: 05-13-1966, 57 y.o.   MRN: [id number]   Patient declined consent to speak with his wife or other family/friend. CSW encouraged him to provide someone as he eluded to having weapons in the home, stating "my wife knows what to do and knows about the guns the signs, she is the reason I am  here."

## 2023-03-05 NOTE — Progress Notes (Signed)
D: Patient is alert, oriented, and cooperative. Denies SI, HI, AVH, and verbally contracts for safety.    A: Scheduled medications administered per MD order. Support provided. Patient educated on safety on the unit and medications. Routine safety checks every 15 minutes. Patient stated understanding to tell nurse about any new physical symptoms. Patient understands to tell staff of any needs.     R: No adverse drug reactions noted. Patient remains safe at this time and will continue to monitor.    03/05/23 1000  Psych Admission Type (Psych Patients Only)  Admission Status Voluntary  Psychosocial Assessment  Patient Complaints Anxiety;Substance abuse;Worrying  Eye Contact Brief  Facial Expression Flat;Sad;Anxious  Affect Apprehensive  Speech Logical/coherent  Interaction Guarded  Motor Activity Restless  Appearance/Hygiene Unremarkable  Behavior Characteristics Cooperative  Mood Anxious;Depressed  Thought Process  Coherency WDL  Content WDL  Delusions None reported or observed  Perception WDL  Hallucination None reported or observed  Judgment Impaired  Confusion None  Danger to Self  Current suicidal ideation? Denies  Danger to Others  Danger to Others None reported or observed

## 2023-03-05 NOTE — Group Note (Signed)
LCSW Group Therapy Note   Group Date: 03/05/2023 Start Time: 1100 End Time: 1200  Type of Therapy and Topic: Group Therapy: Relationship Check-Ins   Participation Level: Did Not Attend   Description Group:  In this group patients are encouraged to rate how well or not well they are able to improve their relationships in Beliefs and Values, Communication, Family and Friends, Actuary and Household, and Intimacy. Patients will be able to discuss and identify what is going well within these aspects and what is not. Patients will be able to find appropriate ways, solutions, and skills that will help them within the selected relationships category. Patients will be encouraged to share and reflect on why things within their relationships are not going so well and get feedback from the instructor or their peers.  This group will be solution focused and process-oriented with patients' participation in sharing and listening to their own and peers experience; along with receive support and advice on how to improve or change the circumstance that is known to be challenging in their relationships category (Beliefs and Values, Communication, Family and Friends, Actuary and Household, and Intimacy).   Therapeutic Goals:  Patient will identify their strengths and weakness within their Beliefs and Values, Communication, Family and Friends, Actuary and Household, and Intimacy relationships. Patient will identify reasons why and how they can improve their relationships.  Patient will identify how they can be supportive and honest to themselves and in their relationships.  Patient will be able to gain support and give support to others with similar challenges.   Summary of Patient Progress  Pt did not attend group.   Therapeutic Modalities:  Solution Focused Therapy Cognitive Behavioral Therapy  Psychodynamic Therapy  Dialectical Behavior Therapy   Rafik Gilley , LCSWA  Izell Peterson,  LCSW 03/05/2023  12:26 PM
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
## 2023-03-05 NOTE — [facility] Group Notes (Signed)
Adult Psychoeducational Group Note  Date:  03/05/2023 Time:  11:36 AM  Group Topic/Focus:  Goals Group:   The focus of this group is to help patients establish daily goals to achieve during treatment and discuss how the patient can incorporate goal setting into their daily lives to aide in recovery.  Participation Level:  Minimal  Participation Quality:  Appropriate  Affect:  Appropriate  Cognitive:  Appropriate  Insight: Limited  Engagement in Group:  Limited  Modes of Intervention:  Discussion and Education  Additional Comments:  Pt participate in group. Pt stated his goal is to be open and learn something new,different. Pt did not say too much durign the group however, he did actively listened  Burnett Sheng 03/05/2023, 11:36 AM

## 2023-03-05 NOTE — Plan of Care (Signed)
  Problem: Education: Goal: Knowledge of [place] [facility] Education information/materials will improve Outcome: Progressing Goal: Emotional status will improve Outcome: Progressing Goal: Mental status will improve Outcome: Progressing   

## 2023-03-05 NOTE — Group Note (Signed)
Date:  03/05/2023 Time:  10:58 PM  Group Topic/Focus:  Wrap-Up Group:   The focus of this group is to help patients review their daily goal of treatment and discuss progress on daily workbooks.    Participation Level:  Active  Participation Quality:  Appropriate and Sharing  Affect:  Appropriate  Cognitive:  Appropriate  Insight: Appropriate  Engagement in Group:  Engaged  Modes of Intervention:  Discussion and Socialization  Additional Comments:  The group started off with a question; what is your favorite sport/hobby? The patient stated that his favorite hobby is being outside and that he enjoys running. The patient stated that is a way that he relieves stress. The patient stated that he had a rough day. The patient stated that he is jsut trying to get adjusted to things. The patient stated that his goal for tomorrow is to continues learning and work on Pharmacologist.   Anthony [name] 03/05/2023, 10:58 PM

## 2023-03-06 ENCOUNTER — Encounter (HOSPITAL_COMMUNITY): Payer: Self-pay

## 2023-03-06 DIAGNOSIS — F332 Major depressive disorder, recurrent severe without psychotic features: Secondary | ICD-10-CM | POA: Diagnosis not present

## 2023-03-06 LAB — TSH: TSH: 2.35 u[IU]/mL (ref 0.350–4.500)

## 2023-03-06 MED ORDER — BUPROPION HCL 75 MG PO TABS
75.0000 mg | ORAL_TABLET | ORAL | Status: DC
  Administered 2023-03-06 – 2023-03-08 (×3): 75 mg via ORAL
  Filled 2023-03-06 (×5): qty 1

## 2023-03-06 NOTE — [facility] Group Notes (Signed)
[facility] Group Notes:  (Nursing/MHT/Case Management/Adjunct)  Date:  03/06/2023  Time:  9:17 PM  Type of Therapy:   AA Meeting  Participation Level:  Active  Participation Quality:  Appropriate, Sharing, and Supportive  Affect:  Appropriate  Cognitive:  Alert and Appropriate  Insight:  Appropriate, Good, and Improving  Engagement in Group:  Engaged, Improving, and Supportive  Modes of Intervention:  Discussion, Education, Socialization, and Support  Summary of Progress/Problems: Pt attended an AA meeting and might have located an AA group outside of our program. The patient mentioned that he was first afraid to talk, but after sharing his story and connecting with volunteers and other peers, he was able to gain some extremely helpful suggestions about what to do next in his healing process.   Granville Lewis 03/06/2023, 9:17 PM

## 2023-03-06 NOTE — [facility] Group Notes (Signed)
Adult Psychoeducational Group Note  Date:  03/06/2023 Time:  11:25 AM  Group Topic/Focus:  Goals Group:   The focus of this group is to help patients establish daily goals to achieve during treatment and discuss how the patient can incorporate goal setting into their daily lives to aide in recovery.  Participation Level:  Minimal  Participation Quality:  Attentive  Affect:  Appropriate  Cognitive:  Appropriate  Insight: Appropriate  Engagement in Group:  Engaged  Modes of Intervention:  Discussion and Exploration  Additional Comments:  Pt participated in group. Pt stated that his goal Is to improve and be a better person for his family.  Facilitator ended the group with several rounds of "Would You Rather" questions  Burnett Sheng 03/06/2023, 11:25 AM

## 2023-03-06 NOTE — [facility] Group Notes (Signed)
Adult Psychoeducational Group Note  Date:  03/06/2023 Time:  8:43 PM  Group Topic/Focus:  Wrap-Up Group:   The focus of this group is to help patients review their daily goal of treatment and discuss progress on daily workbooks.  Participation Level:  Did Not Attend  Participation Quality:    Affect:    Cognitive:    Insight:   Engagement in Group:    Modes of Intervention:    Additional Comments:  Pt did not attend group. Anthony [name] 03/06/2023, 8:43 PM

## 2023-03-06 NOTE — Plan of Care (Signed)
Patient compliant with treatment plan verbalized denies SI/HI/A/VH and verbally contracts for safety. Patient reports poor sleep refused PRN vistaril or Trazodone Patient started that Seroquel works better. Q 15 minutes safety checks ongoing. Support and encouragement provided.   Problem: Education: Goal: Emotional status will improve Outcome: Progressing   Problem: Physical Regulation: Goal: Ability to maintain clinical measurements within normal limits will improve  Outcome: Progressing

## 2023-03-06 NOTE — Plan of Care (Signed)
  Problem: Education: Goal: Knowledge of [facility] Education information/materials will improve Outcome: Progressing Goal: Emotional status will improve Outcome: Progressing   

## 2023-03-06 NOTE — BH IP Treatment Plan (Signed)
Interdisciplinary Treatment and Diagnostic Plan Update  03/06/2023 Time of Session: 10:35 AM  Anthony [name] MRN: [id number]  Principal Diagnosis: MDD (major depressive disorder), recurrent episode, severe (HCC)  Secondary Diagnoses: Principal Problem:   MDD (major depressive disorder), recurrent episode, severe (HCC)   Current Medications:  Current Facility-Administered Medications  Medication Dose Route Frequency Provider Last Rate Last Admin   acetaminophen (TYLENOL) tablet 650 mg  650 mg Oral Q6H PRN Starkes-Perry, Juel Burrow, FNP       alum & mag hydroxide-simeth (MAALOX/MYLANTA) 200-200-20 MG/5ML suspension 30 mL  30 mL Oral Q4H PRN Starkes-Perry, Juel Burrow, FNP       buPROPion [facility]) tablet 75 mg  75 mg Oral Cyndy Freeze, FNP   75 mg at 03/06/23 5009   diphenhydrAMINE (BENADRYL) capsule 50 mg  50 mg Oral TID PRN Maryagnes Amos, FNP       Or   diphenhydrAMINE (BENADRYL) injection 50 mg  50 mg Intramuscular TID PRN Starkes-Perry, Juel Burrow, FNP       docusate sodium (COLACE) capsule 100 mg  100 mg Oral BID PRN Starkes-Perry, Juel Burrow, FNP       feeding supplement (ENSURE ENLIVE / ENSURE PLUS) liquid 237 mL  237 mL Oral BID BM Nkwenti, Doris, NP       folic acid (FOLVITE) tablet 1 mg  1 mg Oral Daily Maryagnes Amos, FNP   1 mg at 03/06/23 3818   haloperidol (HALDOL) tablet 5 mg  5 mg Oral TID PRN Maryagnes Amos, FNP       Or   haloperidol lactate (HALDOL) injection 5 mg  5 mg Intramuscular TID PRN Maryagnes Amos, FNP       hydrOXYzine (ATARAX) tablet 25 mg  25 mg Oral TID PRN Cecilie Lowers, FNP   25 mg at 03/05/23 2137   LORazepam (ATIVAN) tablet 2 mg  2 mg Oral TID PRN Maryagnes Amos, FNP       Or   LORazepam (ATIVAN) injection 2 mg  2 mg Intramuscular TID PRN Maryagnes Amos, FNP       losartan (COZAAR) tablet 25 mg  25 mg Oral Daily Maryagnes Amos, FNP   25 mg at 03/06/23 2993   magnesium hydroxide (MILK OF MAGNESIA)  suspension 30 mL  30 mL Oral Daily PRN Maryagnes Amos, FNP       multivitamin with minerals tablet 1 tablet  1 tablet Oral Daily Maryagnes Amos, FNP   1 tablet at 03/06/23 0748   naltrexone (DEPADE) tablet 50 mg  50 mg Oral Daily Maryagnes Amos, FNP   50 mg at 03/06/23 0748   polyethylene glycol (MIRALAX / GLYCOLAX) packet 17 g  17 g Oral Daily PRN Maryagnes Amos, FNP       thiamine (Vitamin B-1) tablet 100 mg  100 mg Oral Daily Maryagnes Amos, FNP   100 mg at 03/06/23 0748   traZODone (DESYREL) tablet 50 mg  50 mg Oral QHS PRN Sindy Guadeloupe, NP   50 mg at 03/05/23 2137   PTA Medications: Medications Prior to Admission  Medication Sig Dispense Refill Last Dose   buPROPion (WELLBUTRIN) 75 MG tablet Take 1 tablet (75 mg total) by mouth daily.      folic acid (FOLVITE) 1 MG tablet Take 1 tablet (1 mg total) by mouth daily.      losartan (COZAAR) 25 MG tablet Take 1 tablet (25 mg total) by mouth daily.  naltrexone (DEPADE) 50 MG tablet Take 1 tablet (50 mg total) by mouth daily.      polyethylene glycol (MIRALAX / GLYCOLAX) 17 g packet Take 17 g by mouth daily as needed for moderate constipation.      thiamine (VITAMIN B1) 100 MG tablet Take 100 mg by mouth every morning.       Patient Stressors: Substance abuse    Patient Strengths: Average or above average intelligence  Capable of independent living  Communication skills  General fund of knowledge  Motivation for treatment/growth   Treatment Modalities: Medication Management, Group therapy, Case management,  1 to 1 session with clinician, Psychoeducation, Recreational therapy.   Physician Treatment Plan for Primary Diagnosis: MDD (major depressive disorder), recurrent episode, severe (HCC) Long Term Goal(s): Improvement in symptoms so as ready for discharge   Short Term Goals: Ability to identify changes in lifestyle to reduce recurrence of condition will improve Ability to verbalize  feelings will improve Ability to disclose and discuss suicidal ideas Ability to demonstrate self-control will improve Ability to identify and develop effective coping behaviors will improve Ability to maintain clinical measurements within normal limits will improve Compliance with prescribed medications will improve Ability to identify triggers associated with substance abuse/mental health issues will improve  Medication Management: Evaluate patient's response, side effects, and tolerance of medication regimen.  Therapeutic Interventions: 1 to 1 sessions, Unit Group sessions and Medication administration.  Evaluation of Outcomes: Not Progressing  Physician Treatment Plan for Secondary Diagnosis: Principal Problem:   MDD (major depressive disorder), recurrent episode, severe (HCC)  Long Term Goal(s): Improvement in symptoms so as ready for discharge   Short Term Goals: Ability to identify changes in lifestyle to reduce recurrence of condition will improve Ability to verbalize feelings will improve Ability to disclose and discuss suicidal ideas Ability to demonstrate self-control will improve Ability to identify and develop effective coping behaviors will improve Ability to maintain clinical measurements within normal limits will improve Compliance with prescribed medications will improve Ability to identify triggers associated with substance abuse/mental health issues will improve     Medication Management: Evaluate patient's response, side effects, and tolerance of medication regimen.  Therapeutic Interventions: 1 to 1 sessions, Unit Group sessions and Medication administration.  Evaluation of Outcomes: Not Progressing   RN Treatment Plan for Primary Diagnosis: MDD (major depressive disorder), recurrent episode, severe (HCC) Long Term Goal(s): Knowledge of disease and therapeutic regimen to maintain health will improve  Short Term Goals: Ability to remain free from injury will  improve, Ability to verbalize frustration and anger appropriately will improve, Ability to demonstrate self-control, Ability to participate in decision making will improve, Ability to verbalize feelings will improve, Ability to disclose and discuss suicidal ideas, Ability to identify and develop effective coping behaviors will improve, and Compliance with prescribed medications will improve  Medication Management: RN will administer medications as ordered by provider, will assess and evaluate patient's response and provide education to patient for prescribed medication. RN will report any adverse and/or side effects to prescribing provider.  Therapeutic Interventions: 1 on 1 counseling sessions, Psychoeducation, Medication administration, Evaluate responses to treatment, Monitor vital signs and CBGs as ordered, Perform/monitor CIWA, COWS, AIMS and Fall Risk screenings as ordered, Perform wound care treatments as ordered.  Evaluation of Outcomes: Not Progressing   LCSW Treatment Plan for Primary Diagnosis: MDD (major depressive disorder), recurrent episode, severe (HCC) Long Term Goal(s): Safe transition to appropriate next level of care at discharge, Engage patient in therapeutic group addressing interpersonal concerns.  Short Term Goals: Engage patient in aftercare planning with referrals and resources, Increase social support, Increase ability to appropriately verbalize feelings, Increase emotional regulation, Facilitate acceptance of mental health diagnosis and concerns, Facilitate patient progression through stages of change regarding substance use diagnoses and concerns, Identify triggers associated with mental health/substance abuse issues, and Increase skills for wellness and recovery  Therapeutic Interventions: Assess for all discharge needs, 1 to 1 time with Social worker, Explore available resources and support systems, Assess for adequacy in community support network, Educate family and  significant other(s) on suicide prevention, Complete Psychosocial Assessment, Interpersonal group therapy.  Evaluation of Outcomes: Not Progressing   Progress in Treatment: Attending groups: Yes. Participating in groups: Yes. Taking medication as prescribed: Yes. Toleration medication: Yes. Family/Significant other contact made: No, will contact:  Pt declined  Patient understands diagnosis: Yes. Discussing patient identified problems/goals with staff: Yes. Medical problems stabilized or resolved: Yes. Denies suicidal/homicidal ideation: Yes. Issues/concerns per patient self-inventory: No.   New problem(s) identified: No, Describe:  None reported   New Short Term/Long Term Goal(s):detox, medication management for mood stabilization; elimination of SI thoughts; development of comprehensive mental wellness/sobriety plan  Patient Goals:  " deal with my alcoholism and depression "   Discharge Plan or Barriers: Patient recently admitted. CSW will continue to follow and assess for appropriate referrals and possible discharge planning.    Reason for Continuation of Hospitalization: Depression Medication stabilization Suicidal ideation Withdrawal symptoms  Estimated Length of Stay: 3-5 days   Last 3 [place] Suicide Severity Risk Score: Flowsheet Row [facility] (Current) [facility] from [facility]24/2023 [facility] from 02/20/2023 in [facility]  C-SSRS RISK CATEGORY No Risk No Risk No Risk       Last PHQ 2/9 Scores:    01/12/2023    2:52 PM  Depression screen PHQ 2/9  Decreased Interest 2  Down, Depressed, Hopeless 1  PHQ - 2 Score 3  Altered sleeping 2  Tired, decreased energy 1  Change in appetite 0  Feeling bad or failure about yourself  1  Trouble concentrating 0  Moving slowly or fidgety/restless 0  Suicidal thoughts 0  PHQ-9 Score 7     Scribe for Treatment Team: Darrin, Dervisevic 03/06/2023 12:55 PM

## 2023-03-06 NOTE — Group Note (Signed)
Recreation Therapy Group Note   Group Topic:Team Building  Group Date: 03/06/2023 Start Time: 0930 End Time: 1000 Facilitators: Keaisha Sublette-McCall, LRT,CTRS Location: [address] Day[address]   Goal Area(s) Addresses:  Patient will effectively work with peer towards shared goal.  Patient will identify skills used to make activity successful.  Patient will identify how skills used during activity can be applied to reach post d/c goals.   Group Description: [employer]. In teams of 5-6, patients were given 11 craft pipe cleaners. Using the materials provided, patients were instructed to compete again the opposing team(s) to build the tallest free-standing structure from floor level. The activity was timed; difficulty increased by Clinical research associate as Production designer, theatre/television/film continued.  Systematically resources were removed with additional directions for example, placing one arm behind their back, working in silence, and shape stipulations. LRT facilitated post-activity discussion reviewing team processes and necessary communication skills involved in completion. Patients were encouraged to reflect how the skills utilized, or not utilized, in this activity can be incorporated to positively impact support systems post discharge.   Clinical Observations/Individualized Feedback: Group did not occur due to previous group going over into group time.    Plan: Continue to engage patient in RT group sessions 2-3x/week.   Anelia Carriveau-McCall, LRT,CTRS 03/06/2023 12:06 PM

## 2023-03-06 NOTE — Progress Notes (Signed)
   03/05/23 2100  Psych Admission Type (Psych Patients Only)  Admission Status Voluntary  Psychosocial Assessment  Patient Complaints Anxiety;Substance abuse  Eye Contact Brief  Facial Expression Anxious;Flat;Sad  Affect Apprehensive  Speech Logical/coherent  Interaction Guarded  Motor Activity Restless  Appearance/Hygiene Unremarkable  Behavior Characteristics Cooperative  Mood Depressed;Anxious  Thought Process  Coherency WDL  Content WDL  Delusions None reported or observed  Perception WDL  Hallucination None reported or observed  Judgment Impaired  Confusion None  Danger to Self  Current suicidal ideation? Denies  Danger to Others  Danger to Others None reported or observed

## 2023-03-06 NOTE — Progress Notes (Addendum)
D: Patient is alert, oriented, and cooperative. Denies SI, HI, AVH, and verbally contracts for safety. Patient reports he slept fair last night and that the sleeping medication he took did not help. Patient reports his appetite as good, energy level as normal, and concentration as good. Patient rates his depression 3/10, hopelessness 2/10, and anxiety 2/10. Patient denies physical symptoms/pain.     A: Scheduled medications administered per MD order. Support provided. Patient educated on safety on the unit and medications. Routine safety checks every 15 minutes. Patient stated understanding to tell nurse about any new physical symptoms. Patient understands to tell staff of any needs.     R: No adverse drug reactions noted. Patient remains safe at this time and will continue to monitor.    03/06/23 1000  Psych Admission Type (Psych Patients Only)  Admission Status Voluntary  Psychosocial Assessment  Patient Complaints Anxiety;Substance abuse  Eye Contact Brief  Facial Expression Anxious;Flat;Sad  Affect Apprehensive  Speech Logical/coherent  Interaction Guarded  Motor Activity Restless  Appearance/Hygiene Unremarkable  Behavior Characteristics Cooperative  Mood Depressed;Anxious  Thought Process  Coherency WDL  Content WDL  Delusions None reported or observed  Perception WDL  Hallucination None reported or observed  Judgment Impaired  Confusion None  Danger to Self  Current suicidal ideation? Denies  Danger to Others  Danger to Others None reported or observed

## 2023-03-06 NOTE — Progress Notes (Addendum)
[facility] MD Progress Note  03/06/2023 4:56 PM Anthony [name]  MRN:  [id number]  Principal Problem: MDD (major depressive disorder), recurrent episode, severe (HCC) Diagnosis: Principal Problem:   MDD (major depressive disorder), recurrent episode, severe (HCC)  Reason for admission: Anthony R.  Hillie is a 57 year old Caucasian male with prior psychiatric history significant for depression and alcohol use disorder, who presents voluntarily [facility] from [name], ED for worsening depression and alcoholism and seeking alcohol detox.  At the Hospital, patient became increasingly medically unstable, confused, disoriented to self and situation, obtunded with persistent tremor. After medical evaluation / stabilization & clearance, he was transferred to the [facility] for further psychiatric evaluation & treatments.   Yesterday the psychiatry team made the following recommendations: -- Continue Wellbutrin 75 mg p.o. daily for depression (patient denies history of alcohol withdrawal seizures or epilepsy) --Continue naltrexone tablet 50 mg p.o. daily for treatment of alcohol dependence --Continue 50 mg p.o. q. nightly as needed as ordered tablet 50 mg p.o. nightly as needed for insomnia --Continue hydroxyzine tablets 25 mg p.o. 3 times daily as needed for anxiety.  Today's assessment notes: On assessment today, the pt reports that his mood is less depressed.  Anthony [name] is alert, calm, oriented to person, place, time, and situation.  Chart reviewed and findings shared with the treatment team and consult with attending psychiatrist.  Speech clear and coherent, with normal volume and pattern.  Patient denies any physical pain or discomfort.  Expresses concern about discharging from the hospital.  Made patient aware that he would be discharged after being psychiatrically stabilized and responding to the treatment regimen for his alcoholism.  Reports that anxiety is at a manageable level Patient reports sleeping for only 4  hours last night.  Encouraged to ask nursing staff for trazodone 50 mg p.o. q. nightly as ordered for insomnia.   Appetite is good Concentration is improving Energy level is adequate Denies suicidal thoughts and denies suicidal intent or plan.  Denies having any HI.  Denies having psychotic symptoms.   Denies having side effects to current psychiatric medications.   We discussed compliance to current medication regimen to improve his depression and cravings for alcohol.  Discussed the following psychosocial stressors: Encouraged patient to continue attending therapeutic milieu and unit group activities as this could improve his mood.  Total Time spent with patient: 45 minutes  Past Psychiatric History: Previous Psych Diagnoses: Depression Prior inpatient treatment: Denies Current/prior outpatient treatment: Denies Prior rehab hx: Yes, ([facility] from 02/19-02/26, 04/26-05/03, 05/14-05/21; [facility] from 05/21-06/11; [facility] from 06/26-07/03, all in 2024)  Psychotherapy hx: Yes History of suicide: Denies history of suicide History of homicide or aggression: Denies Psychiatric medication history: Patient had been on trial Paxil and Zoloft Psychiatric medication compliance history: Noncompliance Neuromodulation history: Denies Current Psychiatrist: Denies Current therapist: Denies  Past Medical History:  Past Medical History:  Diagnosis Date   Alcohol withdrawal syndrome (HCC)    Alcoholism (HCC)    Anxiety    Depression    ED (erectile dysfunction)    GERD (gastroesophageal reflux disease)    Suicidal ideation    Tinnitus     Past Surgical History:  Procedure Laterality Date   LEG SURGERY Right    broken leg   ROTATOR CUFF REPAIR Bilateral    Family History:  Family History  Problem Relation Age of Onset   Diabetes Mother    Family Psychiatric  History: See H&P  Social History:  Social History  Substance and Sexual Activity  Alcohol Use Yes    Comment: 3 per day     Social History   Substance and Sexual Activity  Drug Use Never    Social History   Socioeconomic History   Marital status: Divorced    Spouse name: Not on file   Number of children: Not on file   Years of education: Not on file   Highest education level: Not on file  Occupational History   Occupation: electrician  Tobacco Use   Smoking status: Never   Smokeless tobacco: Never  Vaping Use   Vaping status: Never Used  Substance and Sexual Activity   Alcohol use: Yes    Comment: 3 per day   Drug use: Never   Sexual activity: Not on file  Other Topics Concern   Not on file  Social History Narrative   Patient is married he has a son born in 2010.   He is an Personnel officer at [employer]   He has alcoholism and is currently drinking   No tobacco or drug use   Social Determinants of Corporate investment banker Strain: Not on file  Food Insecurity: No Food Insecurity (03/04/2023)   Hunger Vital Sign    Worried About Running Out of Food in the Last Year: Never true    Ran Out of Food in the Last Year: Never true  Transportation Needs: No Transportation Needs (03/04/2023)   PRAPARE - Administrator, Civil Service (Medical): No    Lack of Transportation (Non-Medical): No  Physical Activity: Not on file  Stress: Not on file  Social Connections: Unknown (11/19/2021)   Received from [facility]   Social Network    Social Network: Not on file   Additional Social History:    Sleep: Fair  Appetite:  Good  Current Medications: Current Facility-Administered Medications  Medication Dose Route Frequency Provider Last Rate Last Admin   acetaminophen (TYLENOL) tablet 650 mg  650 mg Oral Q6H PRN Starkes-Perry, Juel Burrow, FNP       alum & mag hydroxide-simeth (MAALOX/MYLANTA) 200-200-20 MG/5ML suspension 30 mL  30 mL Oral Q4H PRN Starkes-Perry, Juel Burrow, FNP       buPROPion [facility]) tablet 75 mg  75 mg Oral Cyndy Freeze, FNP   75  mg at 03/06/23 2440   diphenhydrAMINE (BENADRYL) capsule 50 mg  50 mg Oral TID PRN Maryagnes Amos, FNP       Or   diphenhydrAMINE (BENADRYL) injection 50 mg  50 mg Intramuscular TID PRN Starkes-Perry, Juel Burrow, FNP       docusate sodium (COLACE) capsule 100 mg  100 mg Oral BID PRN Starkes-Perry, Juel Burrow, FNP       feeding supplement (ENSURE ENLIVE / ENSURE PLUS) liquid 237 mL  237 mL Oral BID BM Nkwenti, Doris, NP       folic acid (FOLVITE) tablet 1 mg  1 mg Oral Daily Maryagnes Amos, FNP   1 mg at 03/06/23 1027   haloperidol (HALDOL) tablet 5 mg  5 mg Oral TID PRN Maryagnes Amos, FNP       Or   haloperidol lactate (HALDOL) injection 5 mg  5 mg Intramuscular TID PRN Maryagnes Amos, FNP       hydrOXYzine (ATARAX) tablet 25 mg  25 mg Oral TID PRN Cecilie Lowers, FNP   25 mg at 03/05/23 2137   LORazepam (ATIVAN) tablet 2 mg  2 mg Oral  TID PRN Maryagnes Amos, FNP       Or   LORazepam (ATIVAN) injection 2 mg  2 mg Intramuscular TID PRN Rosario Adie, Juel Burrow, FNP       losartan (COZAAR) tablet 25 mg  25 mg Oral Daily Maryagnes Amos, FNP   25 mg at 03/06/23 6433   magnesium hydroxide (MILK OF MAGNESIA) suspension 30 mL  30 mL Oral Daily PRN Maryagnes Amos, FNP       multivitamin with minerals tablet 1 tablet  1 tablet Oral Daily Maryagnes Amos, FNP   1 tablet at 03/06/23 0748   naltrexone (DEPADE) tablet 50 mg  50 mg Oral Daily Maryagnes Amos, FNP   50 mg at 03/06/23 0748   polyethylene glycol (MIRALAX / GLYCOLAX) packet 17 g  17 g Oral Daily PRN Maryagnes Amos, FNP       thiamine (Vitamin B-1) tablet 100 mg  100 mg Oral Daily Maryagnes Amos, FNP   100 mg at 03/06/23 2951   traZODone (DESYREL) tablet 50 mg  50 mg Oral QHS PRN Sindy Guadeloupe, NP   50 mg at 03/05/23 2137    Lab Results:  Results for orders placed or performed during the hospital encounter of 03/04/23 (from the past 48 hour(s))  Lipid panel      Status: Abnormal   Collection Time: 03/05/23  6:25 PM  Result Value Ref Range   Cholesterol 183 0 - 200 mg/dL   Triglycerides 54 <884 mg/dL   HDL 70 >16 mg/dL   Total CHOL/HDL Ratio 2.6 RATIO   VLDL 11 0 - 40 mg/dL   LDL Cholesterol 606 (H) 0 - 99 mg/dL    Comment:        Total Cholesterol/HDL:CHD Risk Coronary Heart Disease Risk Table                     Men   Women  1/2 Average Risk   3.4   3.3  Average Risk       5.0   4.4  2 X Average Risk   9.6   7.1  3 X Average Risk  23.4   11.0        Use the calculated Patient Ratio above and the CHD Risk Table to determine the patient's CHD Risk.        ATP III CLASSIFICATION (LDL):  <100     mg/dL   Optimal  301-601  mg/dL   Near or Above                    Optimal  130-159  mg/dL   Borderline  093-235  mg/dL   High  >573     mg/dL   Very High Performed at [facility], [address], [place], [place] [address]   Hemoglobin A1c     Status: None   Collection Time: 03/05/23  6:25 PM  Result Value Ref Range   Hgb A1c MFr Bld 5.3 4.8 - 5.6 %    Comment: (NOTE) Pre diabetes:          5.7%-6.4%  Diabetes:              >6.4%  Glycemic control for   <7.0% adults with diabetes    Mean Plasma Glucose 105.41 mg/dL    Comment: Performed at [facility], [address] [place], [place] [address]  TSH     Status: None  Collection Time: 03/06/23  6:41 AM  Result Value Ref Range   TSH 2.350 0.350 - 4.500 uIU/mL    Comment: Performed by a 3rd Generation assay with a functional sensitivity of <=0.01 uIU/mL. Performed at [facility], [address], [place], [place] [address]    Blood Alcohol level:  Lab Results  Component Value Date   ETH <10 02/25/2023   ETH 232 (H) 02/24/2023   Metabolic Disorder Labs: Lab Results  Component Value Date   HGBA1C 5.3 03/05/2023   MPG 105.41 03/05/2023   No results found for: "PROLACTIN" Lab Results  Component Value Date   CHOL 183 03/05/2023    TRIG 54 03/05/2023   HDL 70 03/05/2023   CHOLHDL 2.6 03/05/2023   VLDL 11 03/05/2023   LDLCALC 102 (H) 03/05/2023   Physical Findings: AIMS:  , ,  ,  ,    CIWA:    COWS:     Musculoskeletal: Strength & Muscle Tone: within normal limits Gait & Station: normal Patient leans: N/A  Psychiatric Specialty Exam:  Presentation  General Appearance:  Appropriate for Environment; Casual; Fairly Groomed  Eye Contact: Good  Speech: Clear and Coherent; Normal Rate  Speech Volume: Normal  Handedness: Right  Mood and Affect  Mood: Anxious; Depressed  Affect: Appropriate; Congruent  Thought Process  Thought Processes: Coherent; Goal Directed  Descriptions of Associations:Intact  Orientation:Full (Time, Place and Person)  Thought Content:Logical  History of Schizophrenia/Schizoaffective disorder:No  Duration of Psychotic Symptoms:No data recorded Hallucinations:Hallucinations: None  Ideas of Reference:None  Suicidal Thoughts:Suicidal Thoughts: No  Homicidal Thoughts:Homicidal Thoughts: No  Sensorium  Memory: Immediate Fair; Recent Fair  Judgment: Fair  Insight: Fair  Art therapist  Concentration: Good  Attention Span: Good  Recall: Fair  Fund of Knowledge: Fair  Language: Good  Psychomotor Activity  Psychomotor Activity: Psychomotor Activity: Normal  Assets  Assets: Communication Skills; Housing; Physical Health; Resilience; Social Support  Sleep  Sleep: Sleep: Fair Number of Hours of Sleep: 4  Physical Exam: Physical Exam Vitals and nursing note reviewed.  HENT:     Head: Normocephalic.     Nose: Nose normal.     Mouth/Throat:     Mouth: Mucous membranes are moist.  Eyes:     Extraocular Movements: Extraocular movements intact.  Cardiovascular:     Rate and Rhythm: Normal rate.     Pulses: Normal pulses.  Pulmonary:     Effort: Pulmonary effort is normal.  Abdominal:     Comments: Deferred  Genitourinary:     Comments: Deferred Musculoskeletal:        General: Normal range of motion.     Cervical back: Normal range of motion.  Skin:    General: Skin is warm.  Neurological:     General: No focal deficit present.     Mental Status: He is alert and oriented to person, place, and time.  Psychiatric:        Mood and Affect: Mood normal.        Behavior: Behavior normal.        Thought Content: Thought content normal.   Review of Systems  Constitutional:  Negative for chills and fever.  HENT:  Negative for sore throat.   Eyes:  Negative for blurred vision.  Respiratory:  Negative for cough, shortness of breath and wheezing.   Cardiovascular:  Negative for chest pain and palpitations.  Gastrointestinal:  Negative for abdominal pain, heartburn, nausea and vomiting.  Genitourinary: Negative.   Musculoskeletal: Negative.  Skin:  Negative for itching and rash.  Neurological:  Negative for dizziness, tingling, tremors and headaches.  Endo/Heme/Allergies:        See allergy listing  Psychiatric/Behavioral:  Positive for depression. The patient is nervous/anxious and has insomnia.    Blood pressure 107/86, pulse 84, temperature 97.9 F (36.6 C), temperature source Oral, resp. rate 16, height 5\' 8"  (1.727 m), weight 73.1 kg, SpO2 100%. Body mass index is 24.51 kg/m.  Treatment Plan Summary: Daily contact with patient to assess and evaluate symptoms and progress in treatment and Medication management  Physician Treatment Plan for Primary Diagnosis:  Assessment: MDD (major depressive disorder), recurrent episode, severe (HCC)   Plans: Medications: -- Continue Wellbutrin 75 mg p.o. daily for depression (patient denies history of alcohol withdrawal seizures or epilepsy) --Continue naltrexone tablet 50 mg p.o. daily for treatment of alcohol dependence --Continue trazodone tablet 50 mg p.o. nightly as needed for insomnia --Continue hydroxyzine tablets 25 mg p.o. 3 times daily as needed for  anxiety   Other medical medications: Losartan tablet 25 mg p.o. daily for high blood pressure Folic acid tablet 1 mg p.o. for supplement Colace capsule 100 mg p.o. twice daily as needed for mild constipation MiraLAX package 17 g p.o. daily as needed for moderate constipation Multiple vitamin with minerals tablet 1 tablet p.o. daily for vitamin D replacement Thiamine vitamin B 1 tablet 100 mg p.o. daily for replacement Ensure Plus to 37 mL p.o. twice daily between meals   Agitation protocol: Benadryl capsule 50 mg p.o. or IM 3 times daily as needed agitation   Haldol tablets 5 mg po IM 3 times daily as needed agitation   Lorazepam tablet 2 mg p.o. or IM 3 times daily as needed agitation     Other PRN Medications -Acetaminophen 650 mg every 6 as needed/mild pain -Maalox 30 mL oral every 4 as needed/digestion -Magnesium hydroxide 30 mL daily as needed/mild constipation   -- The risks/benefits/side-effects/alternatives to this medication were discussed in detail with the patient and time was given for questions. The patient consents to medication trial.              -- Encouraged patient to participate  in unit milieu and in scheduled group             therapies    Abnormal Labs reviewed: CMP: Albumin 2.9 low, vitamin B1 343.1 high, CBC: WBC 11.4 high, UDS: Positive for benzos, possibly given at the hospital.   Labs ordered: Lipid panel: LDL 102 elevated, TSH: 2.350 WNL, hemoglobin A1c: 5.3 WNL.   EKG: Sinus tachycardia, ventricular rate 126, QT/QTc 346/501   Safety and Monitoring: Voluntary admission to inpatient psychiatric unit for safety, stabilization and treatment Daily contact with patient to assess and evaluate symptoms and progress in treatment Patient's case to be discussed in multi-disciplinary team meeting Observation Level : q15 minute checks Vital signs: q12 hours Precautions: suicide, but pt currently verbally contracts for safety on unit    Discharge  Planning: Social work and case management to assist with discharge planning and identification of hospital follow-up needs prior to discharge Estimated LOS: 5-7 days Discharge Concerns: Need to establish a safety plan; Medication compliance and effectiveness Discharge Goals: Return home with outpatient referrals for mental health follow-up including medication management/psychotherapy.   Long Term Goal(s): Improvement in symptoms so as ready for discharge   Short Term Goals: Ability to identify changes in lifestyle to reduce recurrence of condition will improve, Ability to verbalize feelings will improve,  Ability to disclose and discuss suicidal ideas, Ability to demonstrate self-control will improve, Ability to identify and develop effective coping behaviors will improve, Ability to maintain clinical measurements within normal limits will improve, Compliance with prescribed medications will improve, and Ability to identify triggers associated with substance abuse/mental health issues will improve   Physician Treatment Plan for Secondary Diagnosis: Principal Problem:   MDD (major depressive disorder), recurrent episode, severe (HCC)   I certify that inpatient services furnished can reasonably be expected to improve the patient's condition.      Cecilie Lowers, FNP 03/06/2023, 4:56 PM

## 2023-03-07 DIAGNOSIS — F332 Major depressive disorder, recurrent severe without psychotic features: Secondary | ICD-10-CM | POA: Diagnosis not present

## 2023-03-07 MED ORDER — QUETIAPINE FUMARATE 50 MG PO TABS
50.0000 mg | ORAL_TABLET | Freq: Every day | ORAL | Status: DC
  Administered 2023-03-07: 50 mg via ORAL
  Filled 2023-03-07 (×4): qty 1

## 2023-03-07 NOTE — Plan of Care (Signed)
  Problem: Safety: Goal: Non-violent Restraint(s) Outcome: Progressing   Problem: Education: Goal: Knowledge of [facility] Education information/materials will improve Outcome: Progressing Goal: Emotional status will improve Outcome: Progressing Goal: Mental status will improve Outcome: Progressing Goal: Verbalization of understanding the information provided will improve Outcome: Progressing   Problem: Activity: Goal: Interest or engagement in activities will improve Outcome: Progressing Goal: Sleeping patterns will improve Outcome: Progressing   Problem: Coping: Goal: Ability to verbalize frustrations and anger appropriately will improve Outcome: Progressing

## 2023-03-07 NOTE — Progress Notes (Signed)
   03/07/23 0800  Psych Admission Type (Psych Patients Only)  Admission Status Voluntary  Psychosocial Assessment  Patient Complaints Substance abuse;Sleep disturbance  Eye Contact Brief  Facial Expression Flat;Anxious;Sad  Affect Apprehensive  Speech Logical/coherent  Interaction Guarded  Motor Activity Other (Comment) (wnl)  Behavior Characteristics Cooperative  Mood Pleasant;Depressed  Thought Process  Coherency WDL  Content WDL  Delusions None reported or observed  Perception WDL  Hallucination None reported or observed  Judgment Impaired  Confusion None  Danger to Self  Current suicidal ideation? Denies  Danger to Others  Danger to Others None reported or observed

## 2023-03-07 NOTE — Progress Notes (Signed)
[facility] MD Progress Note  03/07/2023 2:33 PM Anthony [name]  MRN:  [id number]  Principal Problem: MDD (major depressive disorder), recurrent episode, severe (HCC) Diagnosis: Principal Problem:   MDD (major depressive disorder), recurrent episode, severe (HCC)  Reason for admission: Anthony [name] is a 57 year old Caucasian male with prior psychiatric history significant for depression and alcohol use disorder, who presents voluntarily [facility] from [name], ED for worsening depression and alcoholism and seeking alcohol detox.  At the Hospital, patient became increasingly medically unstable, confused, disoriented to self and situation, obtunded with persistent tremor. After medical evaluation / stabilization & clearance, he was transferred to the [facility] for further psychiatric evaluation & treatments.   Yesterday the psychiatry team made the following recommendations:  --Continue Wellbutrin 75 mg p.o. daily for depression (patient denies history of alcohol withdrawal seizures or epilepsy) --Continue naltrexone tablet 50 mg p.o. daily for treatment of alcohol dependence --Initiate Seroquel 50 mg p.o. nightly for sleep --Discontinue trazodone tablet 50 mg p.o. nightly as needed for insomnia due to noneffectiveness --Continue hydroxyzine tablets 25 mg p.o. 3 times daily as needed for anxiety   Today's assessment notes: On assessment today, the pt reports that his mood is less depressed.  Anthony [name] is alert, calm, oriented to person, place, time, and situation.  Chart reviewed and findings shared with the treatment team and consult with attending psychiatrist.  Speech clear and coherent, with normal volume and pattern.  Patient denies any physical pain or discomfort.  Expresses concern about discharging from the hospital and observe mood being agitated when talking about discharge.  Made patient aware that he would be discharged after being psychiatrically stabilized and responding to the treatment regimen for  his alcoholism.  Patient reports poor sleep last night of only 2 hours.  Make this provider aware that trazodone does not work for him for sleep and requesting Seroquel.  Seroquel 50 mg p.o. q. nightly ordered for patient, and we will monitor effectiveness.  Reports that anxiety is at a manageable level Appetite is good Concentration is improving Energy level is adequate Denies suicidal thoughts and denies suicidal intent or plan.  Denies having any HI.  Denies having psychotic symptoms.   Denies having side effects to current psychiatric medications.   We discussed adjustment to current medication regimen Seroquel tablet 50 mg p.o. q. nightly for sleep and discontinuing trazodone 50 mg p.o. q. nightly for sleep due to noneffectiveness.  He continues on Wellbutrin and naltrexone to improve his depression symptoms and decrease cravings for alcohol.  Discussed the following psychosocial stressors: Encouraged patient to continue attending therapeutic milieu and unit group activities as this could improve his mood.  Total Time spent with patient: 45 minutes  Past Psychiatric History: Previous Psych Diagnoses: Depression Prior inpatient treatment: Denies Current/prior outpatient treatment: Denies Prior rehab hx: Yes, ([facility] from 02/19-02/26, 04/26-05/03, 05/14-05/21; [facility] from 05/21-06/11; [facility] from 06/26-07/03, all in 2024)  Psychotherapy hx: Yes History of suicide: Denies history of suicide History of homicide or aggression: Denies Psychiatric medication history: Patient had been on trial Paxil and Zoloft Psychiatric medication compliance history: Noncompliance Neuromodulation history: Denies Current Psychiatrist: Denies Current therapist: Denies  Past Medical History:  Past Medical History:  Diagnosis Date   Alcohol withdrawal syndrome (HCC)    Alcoholism (HCC)    Anxiety    Depression    ED (erectile dysfunction)    GERD (gastroesophageal reflux  disease)    Suicidal ideation    Tinnitus  Past Surgical History:  Procedure Laterality Date   LEG SURGERY Right    broken leg   ROTATOR CUFF REPAIR Bilateral    Family History:  Family History  Problem Relation Age of Onset   Diabetes Mother    Family Psychiatric  History: See H&P  Social History:  Social History   Substance and Sexual Activity  Alcohol Use Yes   Comment: 3 per day     Social History   Substance and Sexual Activity  Drug Use Never    Social History   Socioeconomic History   Marital status: Divorced    Spouse name: Not on file   Number of children: Not on file   Years of education: Not on file   Highest education level: Not on file  Occupational History   Occupation: electrician  Tobacco Use   Smoking status: Never   Smokeless tobacco: Never  Vaping Use   Vaping status: Never Used  Substance and Sexual Activity   Alcohol use: Yes    Comment: 3 per day   Drug use: Never   Sexual activity: Not on file  Other Topics Concern   Not on file  Social History Narrative   Patient is married he has a son born in 2010.   He is an Personnel officer at [employer]   He has alcoholism and is currently drinking   No tobacco or drug use   Social Determinants of Corporate investment banker Strain: Not on file  Food Insecurity: No Food Insecurity (03/04/2023)   Hunger Vital Sign    Worried About Running Out of Food in the Last Year: Never true    Ran Out of Food in the Last Year: Never true  Transportation Needs: No Transportation Needs (03/04/2023)   PRAPARE - Administrator, Civil Service (Medical): No    Lack of Transportation (Non-Medical): No  Physical Activity: Not on file  Stress: Not on file  Social Connections: Unknown (11/19/2021)   Received from [facility]   Social Network    Social Network: Not on file   Additional Social History:    Sleep: Fair  Appetite:  Good  Current Medications: Current Facility-Administered  Medications  Medication Dose Route Frequency Provider Last Rate Last Admin   acetaminophen (TYLENOL) tablet 650 mg  650 mg Oral Q6H PRN Starkes-Perry, Juel Burrow, FNP       alum & mag hydroxide-simeth (MAALOX/MYLANTA) 200-200-20 MG/5ML suspension 30 mL  30 mL Oral Q4H PRN Starkes-Perry, Juel Burrow, FNP       buPROPion [facility]) tablet 75 mg  75 mg Oral Cyndy Freeze, FNP   75 mg at 03/07/23 3557   diphenhydrAMINE (BENADRYL) capsule 50 mg  50 mg Oral TID PRN Maryagnes Amos, FNP       Or   diphenhydrAMINE (BENADRYL) injection 50 mg  50 mg Intramuscular TID PRN Starkes-Perry, Juel Burrow, FNP       docusate sodium (COLACE) capsule 100 mg  100 mg Oral BID PRN Starkes-Perry, Juel Burrow, FNP       feeding supplement (ENSURE ENLIVE / ENSURE PLUS) liquid 237 mL  237 mL Oral BID BM Nkwenti, Doris, NP       folic acid (FOLVITE) tablet 1 mg  1 mg Oral Daily Maryagnes Amos, FNP   1 mg at 03/07/23 0801   haloperidol (HALDOL) tablet 5 mg  5 mg Oral TID PRN Maryagnes Amos, FNP       Or  haloperidol lactate (HALDOL) injection 5 mg  5 mg Intramuscular TID PRN Rosario Adie, Juel Burrow, FNP       hydrOXYzine (ATARAX) tablet 25 mg  25 mg Oral TID PRN Cecilie Lowers, FNP   25 mg at 03/05/23 2137   LORazepam (ATIVAN) tablet 2 mg  2 mg Oral TID PRN Maryagnes Amos, FNP       Or   LORazepam (ATIVAN) injection 2 mg  2 mg Intramuscular TID PRN Rosario Adie, Juel Burrow, FNP       losartan (COZAAR) tablet 25 mg  25 mg Oral Daily Maryagnes Amos, FNP   25 mg at 03/07/23 0801   magnesium hydroxide (MILK OF MAGNESIA) suspension 30 mL  30 mL Oral Daily PRN Maryagnes Amos, FNP       multivitamin with minerals tablet 1 tablet  1 tablet Oral Daily Maryagnes Amos, FNP   1 tablet at 03/07/23 0801   naltrexone (DEPADE) tablet 50 mg  50 mg Oral Daily Maryagnes Amos, FNP   50 mg at 03/07/23 0801   polyethylene glycol (MIRALAX / GLYCOLAX) packet 17 g  17 g Oral Daily PRN  Starkes-Perry, Juel Burrow, FNP       QUEtiapine (SEROQUEL) tablet 50 mg  50 mg Oral QHS Tonya Wantz, Jesusita Oka, FNP       thiamine (Vitamin B-1) tablet 100 mg  100 mg Oral Daily Maryagnes Amos, FNP   100 mg at 03/07/23 4098    Lab Results:  Results for orders placed or performed during the hospital encounter of 03/04/23 (from the past 48 hour(s))  Lipid panel     Status: Abnormal   Collection Time: 03/05/23  6:25 PM  Result Value Ref Range   Cholesterol 183 0 - 200 mg/dL   Triglycerides 54 <119 mg/dL   HDL 70 >14 mg/dL   Total CHOL/HDL Ratio 2.6 RATIO   VLDL 11 0 - 40 mg/dL   LDL Cholesterol 782 (H) 0 - 99 mg/dL    Comment:        Total Cholesterol/HDL:CHD Risk Coronary Heart Disease Risk Table                     Men   Women  1/2 Average Risk   3.4   3.3  Average Risk       5.0   4.4  2 X Average Risk   9.6   7.1  3 X Average Risk  23.4   11.0        Use the calculated Patient Ratio above and the CHD Risk Table to determine the patient's CHD Risk.        ATP III CLASSIFICATION (LDL):  <100     mg/dL   Optimal  956-213  mg/dL   Near or Above                    Optimal  130-159  mg/dL   Borderline  086-578  mg/dL   High  >469     mg/dL   Very High Performed at [facility], [address], [place], [place] [address]   Hemoglobin A1c     Status: None   Collection Time: 03/05/23  6:25 PM  Result Value Ref Range   Hgb A1c MFr Bld 5.3 4.8 - 5.6 %    Comment: (NOTE) Pre diabetes:          5.7%-6.4%  Diabetes:              >  6.4%  Glycemic control for   <7.0% adults with diabetes    Mean Plasma Glucose 105.41 mg/dL    Comment: Performed at [facility], [address], [place], [place] [address]  TSH     Status: None   Collection Time: 03/06/23  6:41 AM  Result Value Ref Range   TSH 2.350 0.350 - 4.500 uIU/mL    Comment: Performed by a 3rd Generation assay with a functional sensitivity of <=0.01 uIU/mL. Performed at [facility],  [address] [place], [place] [address]    Blood Alcohol level:  Lab Results  Component Value Date   ETH <10 02/25/2023   ETH 232 (H) 02/24/2023   Metabolic Disorder Labs: Lab Results  Component Value Date   HGBA1C 5.3 03/05/2023   MPG 105.41 03/05/2023   No results found for: "PROLACTIN" Lab Results  Component Value Date   CHOL 183 03/05/2023   TRIG 54 03/05/2023   HDL 70 03/05/2023   CHOLHDL 2.6 03/05/2023   VLDL 11 03/05/2023   LDLCALC 102 (H) 03/05/2023   Physical Findings: AIMS:  , ,  ,  ,    CIWA:    COWS:     Musculoskeletal: Strength & Muscle Tone: within normal limits Gait & Station: normal Patient leans: N/A  Psychiatric Specialty Exam:  Presentation  General Appearance:  Appropriate for Environment; Casual  Eye Contact: Good  Speech: Clear and Coherent; Normal Rate  Speech Volume: Normal  Handedness: Right  Mood and Affect  Mood: Anxious; Depressed  Affect: Appropriate; Congruent  Thought Process  Thought Processes: Coherent  Descriptions of Associations:Intact  Orientation:Full (Time, Place and Person)  Thought Content:Logical  History of Schizophrenia/Schizoaffective disorder:No  Duration of Psychotic Symptoms:No data recorded Hallucinations:Hallucinations: None  Ideas of Reference:None  Suicidal Thoughts:Suicidal Thoughts: No  Homicidal Thoughts:Homicidal Thoughts: No  Sensorium  Memory: Immediate Good; Recent Good  Judgment: Fair  Insight: Fair  Art therapist  Concentration: Good  Attention Span: Good  Recall: Fair  Fund of Knowledge: Fair  Language: Good  Psychomotor Activity  Psychomotor Activity: Psychomotor Activity: Normal  Assets  Assets: Communication Skills; Desire for Improvement; Physical Health; Resilience  Sleep  Sleep: Sleep: Good Number of Hours of Sleep: 2  Physical Exam: Physical Exam Vitals and nursing note reviewed.  HENT:     Head: Normocephalic.      Nose: Nose normal.     Mouth/Throat:     Mouth: Mucous membranes are moist.  Eyes:     Extraocular Movements: Extraocular movements intact.  Cardiovascular:     Rate and Rhythm: Normal rate.     Pulses: Normal pulses.  Pulmonary:     Effort: Pulmonary effort is normal.  Abdominal:     Comments: Deferred  Genitourinary:    Comments: Deferred Musculoskeletal:        General: Normal range of motion.     Cervical back: Normal range of motion.  Skin:    General: Skin is warm.  Neurological:     General: No focal deficit present.     Mental Status: He is alert and oriented to person, place, and time.  Psychiatric:        Mood and Affect: Mood normal.        Behavior: Behavior normal.        Thought Content: Thought content normal.    Review of Systems  Constitutional:  Negative for chills and fever.  HENT:  Negative for sore throat.   Eyes:  Negative  for blurred vision.  Respiratory:  Negative for cough, shortness of breath and wheezing.   Cardiovascular:  Negative for chest pain and palpitations.  Gastrointestinal:  Negative for abdominal pain, heartburn, nausea and vomiting.  Genitourinary: Negative.   Musculoskeletal: Negative.   Skin:  Negative for itching and rash.  Neurological:  Negative for dizziness, tingling, tremors and headaches.  Endo/Heme/Allergies:        See allergy listing  Psychiatric/Behavioral:  Positive for depression. The patient is nervous/anxious and has insomnia.    Blood pressure 130/86, pulse 87, temperature 98.4 F (36.9 C), temperature source Oral, resp. rate 16, height 5\' 8"  (1.727 m), weight 73.1 kg, SpO2 100%. Body mass index is 24.51 kg/m.  Treatment Plan Summary: Daily contact with patient to assess and evaluate symptoms and progress in treatment and Medication management  Physician Treatment Plan for Primary Diagnosis:  Assessment: MDD (major depressive disorder), recurrent episode, severe (HCC)   Plans: Medications: --Continue  Wellbutrin 75 mg p.o. daily for depression (patient denies history of alcohol withdrawal seizures or epilepsy) --Continue naltrexone tablet 50 mg p.o. daily for treatment of alcohol dependence --Initiate Seroquel 50 mg p.o. nightly for sleep --Discontinue trazodone tablet 50 mg p.o. nightly as needed for insomnia the noneffectiveness --Continue hydroxyzine tablets 25 mg p.o. 3 times daily as needed for anxiety   Other medical medications: Losartan tablet 25 mg p.o. daily for high blood pressure Folic acid tablet 1 mg p.o. for supplement Colace capsule 100 mg p.o. twice daily as needed for mild constipation MiraLAX package 17 g p.o. daily as needed for moderate constipation Multiple vitamin with minerals tablet 1 tablet p.o. daily for vitamin D replacement Thiamine vitamin B 1 tablet 100 mg p.o. daily for replacement Ensure Plus to 37 mL p.o. twice daily between meals   Agitation protocol: Benadryl capsule 50 mg p.o. or IM 3 times daily as needed agitation   Haldol tablets 5 mg po IM 3 times daily as needed agitation   Lorazepam tablet 2 mg p.o. or IM 3 times daily as needed agitation     Other PRN Medications -Acetaminophen 650 mg every 6 as needed/mild pain -Maalox 30 mL oral every 4 as needed/digestion -Magnesium hydroxide 30 mL daily as needed/mild constipation   -- The risks/benefits/side-effects/alternatives to this medication were discussed in detail with the patient and time was given for questions. The patient consents to medication trial.              -- Encouraged patient to participate  in unit milieu and in scheduled group             therapies    Abnormal Labs reviewed: CMP: Albumin 2.9 low, vitamin B1 343.1 high, CBC: WBC 11.4 high, UDS: Positive for benzos, possibly given at the hospital.   Labs ordered: Lipid panel: LDL 102 elevated, TSH: 2.350 WNL, hemoglobin A1c: 5.3 WNL.   EKG: Sinus tachycardia, ventricular rate 126, QT/QTc 346/501   Safety and  Monitoring: Voluntary admission to inpatient psychiatric unit for safety, stabilization and treatment Daily contact with patient to assess and evaluate symptoms and progress in treatment Patient's case to be discussed in multi-disciplinary team meeting Observation Level : q15 minute checks Vital signs: q12 hours Precautions: suicide, but pt currently verbally contracts for safety on unit    Discharge Planning: Social work and case management to assist with discharge planning and identification of hospital follow-up needs prior to discharge Estimated LOS: 5-7 days Discharge Concerns: Need to establish a safety plan; Medication compliance  and effectiveness Discharge Goals: Return home with outpatient referrals for mental health follow-up including medication management/psychotherapy.   Long Term Goal(s): Improvement in symptoms so as ready for discharge   Short Term Goals: Ability to identify changes in lifestyle to reduce recurrence of condition will improve, Ability to verbalize feelings will improve, Ability to disclose and discuss suicidal ideas, Ability to demonstrate self-control will improve, Ability to identify and develop effective coping behaviors will improve, Ability to maintain clinical measurements within normal limits will improve, Compliance with prescribed medications will improve, and Ability to identify triggers associated with substance abuse/mental health issues will improve   Physician Treatment Plan for Secondary Diagnosis: Principal Problem:   MDD (major depressive disorder), recurrent episode, severe (HCC)   I certify that inpatient services furnished can reasonably be expected to improve the patient's condition.      Cecilie Lowers, FNP 03/07/2023, 2:33 PM Patient ID: Anthony [name], male   DOB: 1965/07/27, 57 y.o.   MRN: [id number]

## 2023-03-07 NOTE — [facility] Group Notes (Signed)
[facility] Group Notes:  (Nursing/MHT/Case Management/Adjunct)  Date:  03/07/2023  Time:  8:41 PM  Type of Therapy:  Wrap Up Group  Participation Level:  Active  Participation Quality:  Appropriate  Affect:  Appropriate  Cognitive:  Appropriate  Insight:  Appropriate  Engagement in Group:  Improving  Modes of Intervention:  Discussion  Summary of Progress/Problems:Pt rated his day to be a 6/10 and was happy he made new friends, ready for his discharge tomorrow  Anthony [name] E Navil Kole 03/07/2023, 8:41 PM

## 2023-03-08 DIAGNOSIS — F332 Major depressive disorder, recurrent severe without psychotic features: Secondary | ICD-10-CM | POA: Diagnosis not present

## 2023-03-08 MED ORDER — BUPROPION HCL 75 MG PO TABS: 75.0000 mg | ORAL_TABLET | ORAL | 0 refills | Status: DC

## 2023-03-08 MED ORDER — HYDROXYZINE HCL 25 MG PO TABS: 25.0000 mg | ORAL_TABLET | Freq: Three times a day (TID) | ORAL | 0 refills | Status: AC | PRN

## 2023-03-08 MED ORDER — VITAMIN B-1 100 MG PO TABS: 100.0000 mg | ORAL_TABLET | Freq: Every day | ORAL | 0 refills | Status: AC

## 2023-03-08 MED ORDER — QUETIAPINE FUMARATE 50 MG PO TABS: 50.0000 mg | ORAL_TABLET | Freq: Every day | ORAL | 0 refills | Status: DC

## 2023-03-08 MED ORDER — NALTREXONE HCL 50 MG PO TABS: 50.0000 mg | ORAL_TABLET | Freq: Every day | ORAL | 0 refills | Status: DC

## 2023-03-08 MED ORDER — ADULT MULTIVITAMIN W/MINERALS CH: 1.0000 | ORAL_TABLET | Freq: Every day | ORAL | 0 refills | Status: DC

## 2023-03-08 MED ORDER — DOCUSATE SODIUM 100 MG PO CAPS: 100.0000 mg | ORAL_CAPSULE | Freq: Two times a day (BID) | ORAL | 0 refills | Status: AC | PRN

## 2023-03-08 MED ORDER — POLYETHYLENE GLYCOL 3350 17 G PO PACK: 17.0000 g | PACK | Freq: Every day | ORAL | 0 refills | Status: AC | PRN

## 2023-03-08 NOTE — Progress Notes (Signed)
Discharge Note:  Patient denies SI/HI/AVH at this time. Discharge instructions, AVS, prescriptions, and transition record gone over with patient. Patient agrees to comply with medication management, follow-up visit, and outpatient therapy. Patient belongings returned to patient. Patient questions and concerns addressed and answered. Patient ambulatory off unit. Patient discharged to home with spouse.

## 2023-03-08 NOTE — Group Note (Signed)
LCSW Group Therapy Note   Group Date: 03/08/2023 Start Time: 1000 End Time: 1100   . Type of Therapy and Topic:  Group Therapy: Embracing Change   Participation Level:  None   Description of Group:   In this group, patients shared and discussed the importance change.  The group discussed how change plays a vial role in life and the decision that are made. A group discussion was facilitated in group to encourage participants to challenge negative thought with positives in their lives.  Therapeutic Goals: Patients will identify how the word "Change" resonates with them  Patients will discuss how they handle change Patients will explore negative and positive emotions related to change. Patients will verbalize what change look like for them/   Summary of Patient Progress:  The patient was present and expressed interest through non-verbal cues.  Therapeutic Modalities:   Solution-Focused Therapy Activity  Steffanie Dunn , LCSWA  03/08/2023  1:42 PM

## 2023-03-08 NOTE — Discharge Summary (Signed)
Physician Discharge Summary Note  Patient:  Anthony [name] is an 57 y.o., male MRN:  [id number] DOB:  Nov 23, 1965 Patient phone:  [phone] (home)  Patient address:   [address] [place] [address],  Total Time spent with patient: 30 minutes  Date of Admission:  03/04/2023 Date of Discharge:   03/08/2023  Reason for Admission:   Anthony R.  [name] is a 57 year old Caucasian male with prior psychiatric history significant for depression and alcohol use disorder, who presents voluntarily [facility] from [name], ED for worsening depression and alcoholism and seeking alcohol detox.  At the Hospital, patient became increasingly medically unstable, confused, disoriented to self and situation, obtunded with persistent tremor. After medical evaluation/stabilization & clearance, he was transferred to the [facility] for further psychiatric evaluation & treatments.    Principal Problem: MDD (major depressive disorder), recurrent episode, severe (HCC) Discharge Diagnoses: Principal Problem:   MDD (major depressive disorder), recurrent episode, severe (HCC)  Past Psychiatric History: Previous Psych Diagnoses: Depression Prior inpatient treatment: Denies Current/prior outpatient treatment: Denies Prior rehab hx: Yes, ([facility] from 02/19-02/26, 04/26-05/03, 05/14-05/21; [facility] from 05/21-06/11; [facility] from 06/26-07/03, all in 2024)  Psychotherapy hx: Yes History of suicide: Denies history of suicide History of homicide or aggression: Denies Psychiatric medication history: Patient had been on trial Paxil and Zoloft Psychiatric medication compliance history: Noncompliance Neuromodulation history: Denies Current Psychiatrist: Denies Current therapist: Denies  Past Medical History:  Past Medical History:  Diagnosis Date   Alcohol withdrawal syndrome (HCC)    Alcoholism (HCC)    Anxiety    Depression    ED (erectile dysfunction)    GERD (gastroesophageal reflux disease)     Suicidal ideation    Tinnitus     Past Surgical History:  Procedure Laterality Date   LEG SURGERY Right    broken leg   ROTATOR CUFF REPAIR Bilateral    Family History:  Family History  Problem Relation Age of Onset   Diabetes Mother    Family Psychiatric  History: See H&P  Social History:  Social History   Substance and Sexual Activity  Alcohol Use Yes   Comment: 3 per day     Social History   Substance and Sexual Activity  Drug Use Never    Social History   Socioeconomic History   Marital status: Divorced    Spouse name: Not on file   Number of children: Not on file   Years of education: Not on file   Highest education level: Not on file  Occupational History   Occupation: electrician  Tobacco Use   Smoking status: Never   Smokeless tobacco: Never  Vaping Use   Vaping status: Never Used  Substance and Sexual Activity   Alcohol use: Yes    Comment: 3 per day   Drug use: Never   Sexual activity: Not on file  Other Topics Concern   Not on file  Social History Narrative   Patient is married he has a son born in 2010.   He is an Personnel officer at [employer]   He has alcoholism and is currently drinking   No tobacco or drug use   Social Determinants of Corporate investment banker Strain: Not on file  Food Insecurity: No Food Insecurity (03/04/2023)   Hunger Vital Sign    Worried About Running Out of Food in the Last Year: Never true    Ran Out of Food in the Last Year: Never true  Transportation  Needs: No Transportation Needs (03/04/2023)   PRAPARE - Administrator, Civil Service (Medical): No    Lack of Transportation (Non-Medical): No  Physical Activity: Not on file  Stress: Not on file  Social Connections: Unknown (11/19/2021)   Received from [facility]   Social Network    Social Network: Not on file    Hospital Course:  During the patient's hospitalization, patient had extensive initial psychiatric evaluation, and follow-up  psychiatric evaluations every day.  Psychiatric diagnoses provided upon initial assessment:   MDD (major depressive disorder), recurrent episode, severe (HCC)   GAD    Alcohol use disorder  Patient's psychiatric medications were adjusted on admission:  --Resume Wellbutrin 75 mg p.o. daily for depression (patient denies history of alcohol withdrawal seizures or epilepsy) --Continue naltrexone tablet 50 mg p.o. daily for treatment of alcohol dependence --Continue trazodone tablet 50 mg p.o. nightly as needed for insomnia --Continue hydroxyzine tablets 25 mg p.o. 3 times daily as needed for anxiety   During the hospitalization, other adjustments were made to the patient's psychiatric medication regimen:  Discontinued trazodone tablet 50 mg p.o. q. nightly as needed for insomnia Start Seroquel 50 mg p.o. daily and at bedtime for depression and insomnia  Patient's care was discussed during the interdisciplinary team meeting every day during the hospitalization.  The patient denies having side effects to prescribed psychiatric medication.  Gradually, patient started adjusting to milieu. The patient was evaluated each day by a clinical provider to ascertain response to treatment. Improvement was noted by the patient's report of decreasing symptoms, improved sleep and appetite, affect, medication tolerance, behavior, and participation in unit programming.  Patient was asked each day to complete a self inventory noting mood, mental status, pain, new symptoms, anxiety and concerns.    Symptoms were reported as significantly decreased or resolved completely by discharge.   On day of discharge, the patient reports that their mood is stable. The patient denied having suicidal thoughts for more than 48 hours prior to discharge.  Patient denies having homicidal thoughts.  Patient denies having auditory hallucinations.  Patient denies any visual hallucinations or other symptoms of psychosis. The patient was  motivated to continue taking medication with a goal of continued improvement in mental health.   The patient reports their target psychiatric symptoms of depression responded well to the psychiatric medications, and the patient reports overall benefit other psychiatric hospitalization. Supportive psychotherapy was provided to the patient. The patient also participated in regular group therapy while hospitalized. Coping skills, problem solving as well as relaxation therapies were also part of the unit programming.  Labs were reviewed with the patient, and abnormal results were discussed with the patient.  The patient is able to verbalize their individual safety plan to this provider.  # It is recommended to the patient to continue psychiatric medications as prescribed, after discharge from the hospital.    # It is recommended to the patient to follow up with your outpatient psychiatric provider and PCP.  # It was discussed with the patient, the impact of alcohol, drugs, tobacco have been there overall psychiatric and medical wellbeing, and total abstinence from substance use was recommended the patient.ed.  # Prescriptions provided or sent directly to preferred pharmacy at discharge. Patient agreeable to plan. Given opportunity to ask questions. Appears to feel comfortable with discharge.    # In the event of worsening symptoms, the patient is instructed to call the crisis hotline, 911 and or go to the nearest ED  for appropriate evaluation and treatment of symptoms. To follow-up with primary care provider for other medical issues, concerns and or health care needs  # Patient was discharged to home with a plan to follow up as noted below.   Physical Findings: AIMS:  , ,  ,  ,    CIWA:    COWS:     Musculoskeletal: Strength & Muscle Tone: within normal limits Gait & Station: normal Patient leans: N/A  Psychiatric Specialty Exam:  Presentation  General Appearance:  Appropriate for  Environment; Casual  Eye Contact: Good  Speech: Clear and Coherent; Normal Rate  Speech Volume: Normal  Handedness: Right  Mood and Affect  Mood: Anxious; Depressed  Affect: Appropriate; Congruent  Thought Process  Thought Processes: Coherent  Descriptions of Associations:Intact  Orientation:Full (Time, Place and Person)  Thought Content:Logical  History of Schizophrenia/Schizoaffective disorder:No  Duration of Psychotic Symptoms:No data recorded Hallucinations:Hallucinations: None  Ideas of Reference:None  Suicidal Thoughts:Suicidal Thoughts: No  Homicidal Thoughts:Homicidal Thoughts: No  Sensorium  Memory: Immediate Good; Recent Good  Judgment: Fair  Insight: Fair  Art therapist  Concentration: Good  Attention Span: Good  Recall: Fair  Fund of Knowledge: Fair  Language: Good  Psychomotor Activity  Psychomotor Activity: Psychomotor Activity: Normal  Assets  Assets: Communication Skills; Desire for Improvement; Physical Health; Resilience  Sleep  Sleep: Sleep: Good Number of Hours of Sleep: 2  Physical Exam: Physical Exam Vitals and nursing note reviewed.  HENT:     Nose: Nose normal.     Mouth/Throat:     Mouth: Mucous membranes are moist.  Eyes:     Extraocular Movements: Extraocular movements intact.  Cardiovascular:     Rate and Rhythm: Normal rate.     Pulses: Normal pulses.  Pulmonary:     Effort: Pulmonary effort is normal.  Abdominal:     Comments: Deferred  Genitourinary:    Comments: Deferred Musculoskeletal:        General: Normal range of motion.     Cervical back: Normal range of motion.  Skin:    General: Skin is warm.  Neurological:     General: No focal deficit present.     Mental Status: He is alert and oriented to person, place, and time.  Psychiatric:        Mood and Affect: Mood normal.        Behavior: Behavior normal.        Thought Content: Thought content normal.    Review of  Systems  Constitutional:  Negative for chills and fever.  HENT:  Negative for sore throat.   Eyes:  Negative for blurred vision.  Respiratory:  Negative for cough, shortness of breath and wheezing.   Cardiovascular:  Negative for chest pain and palpitations.  Gastrointestinal:  Negative for abdominal pain, heartburn, nausea and vomiting.  Genitourinary: Negative.   Musculoskeletal: Negative.   Skin:  Negative for itching and rash.  Neurological:  Negative for dizziness, tingling, tremors, sensory change, speech change, focal weakness, seizures, loss of consciousness, weakness and headaches.  Endo/Heme/Allergies:        See allergy listing  Psychiatric/Behavioral:  Positive for depression (Stable with medication). The patient is nervous/anxious (Improved with medication) and has insomnia (Improved with medication).    Blood pressure (!) 127/94, pulse 81, temperature 98.1 F (36.7 C), temperature source Oral, resp. rate 16, height 5\' 8"  (1.727 m), weight 73.1 kg, SpO2 99%. Body mass index is 24.51 kg/m.   Social History   Tobacco Use  Smoking Status Never  Smokeless Tobacco Never   Tobacco Cessation:  N/A, patient does not currently use tobacco products  Blood Alcohol level:  Lab Results  Component Value Date   ETH <10 02/25/2023   ETH 232 (H) 02/24/2023    Metabolic Disorder Labs:  Lab Results  Component Value Date   HGBA1C 5.3 03/05/2023   MPG 105.41 03/05/2023   No results found for: "PROLACTIN" Lab Results  Component Value Date   CHOL 183 03/05/2023   TRIG 54 03/05/2023   HDL 70 03/05/2023   CHOLHDL 2.6 03/05/2023   VLDL 11 03/05/2023   LDLCALC 102 (H) 03/05/2023    See Psychiatric Specialty Exam and Suicide Risk Assessment completed by Attending Physician prior to discharge.  Discharge destination:  Home  Is patient on multiple antipsychotic therapies at discharge:  No   Has Patient had three or more failed trials of antipsychotic monotherapy by history:   No  Recommended Plan for Multiple Antipsychotic Therapies: NA  Discharge Instructions     Increase activity slowly   Complete by: As directed       Allergies as of 03/08/2023   No Known Allergies      Medication List     STOP taking these medications    thiamine 100 MG tablet Commonly known as: VITAMIN B1       TAKE these medications      Indication  buPROPion 75 MG tablet Commonly known as: WELLBUTRIN Take 1 tablet (75 mg total) by mouth every morning. Start taking on: March 09, 2023 What changed: when to take this  Indication: Depression   docusate sodium 100 MG capsule Commonly known as: COLACE Take 1 capsule (100 mg total) by mouth 2 (two) times daily as needed for mild constipation.  Indication: Constipation   folic acid 1 MG tablet Commonly known as: FOLVITE Take 1 tablet (1 mg total) by mouth daily.  Indication: Anemia From Inadequate Folic Acid   hydrOXYzine 25 MG tablet Commonly known as: ATARAX Take 1 tablet (25 mg total) by mouth 3 (three) times daily as needed for anxiety.  Indication: Feeling Anxious   losartan 25 MG tablet Commonly known as: COZAAR Take 1 tablet (25 mg total) by mouth daily.  Indication: High Blood Pressure Disorder   multivitamin with minerals Tabs tablet Take 1 tablet by mouth daily. Start taking on: March 09, 2023  Indication: Deficiency of Vitamin B1   naltrexone 50 MG tablet Commonly known as: DEPADE Take 1 tablet (50 mg total) by mouth daily. Start taking on: March 09, 2023  Indication: Abuse or Misuse of Alcohol   polyethylene glycol 17 g packet Commonly known as: MIRALAX / GLYCOLAX Take 17 g by mouth daily as needed for moderate constipation.  Indication: Constipation   QUEtiapine 50 MG tablet Commonly known as: SEROQUEL Take 1 tablet (50 mg total) by mouth at bedtime.  Indication: Trouble Sleeping, Major Depressive Disorder   thiamine 100 MG tablet Commonly known as: Vitamin B-1 Take 1  tablet (100 mg total) by mouth daily. Start taking on: March 09, 2023  Indication: Deficiency of Vitamin B1        Follow-up Information     [facility].   Contact information: [address]  [address] [place] [address] [phone]                 Follow-up recommendations:   Discharge Recommendations:  The patient is being discharged to Home. Patient is to take his discharge medications  as ordered.  See follow up above. We recommend that he participates in individual therapy to target uncontrollable agitation and substance abuse.  We recommend that he participates in therapy to target personal conflict, to improve communication skills and conflict resolution skills. Patient is to initiate/implement a contingency based behavioral model to address his behavior. We recommend that he gets AIMS scale, height, weight, blood pressure, fasting lipid panel, fasting blood sugar in three months from discharge if he's on atypical antipsychotics.  Patient will benefit from monitoring of recurrent suicidal ideation since patient is on antidepressant medication. The patient should abstain from all illicit substances and alcohol. If the patient's symptoms worsen or do not continue to improve or if the patient becomes actively suicidal or homicidal then it is recommended that the patient return to the closest hospital emergency room or call 911 for further evaluation and treatment. National Suicide Prevention Lifeline 1800-SUICIDE or 260 488 2384. Please follow up with your primary medical doctor for all other medical needs.  The patient has been educated on the possible side effects to medications and she/her guardian is to contact a medical professional and inform outpatient provider of any new side effects of medication. He is to take regular diet and activity as tolerated.  Will benefit from moderate daily exercise. Patient and Family was educated about removing/locking  any firearms, medications or dangerous products from the home.  Activity:  As tolerated Diet:  Regular Diet Signed: Cecilie Lowers, FNP 03/08/2023, 1:34 PM

## 2023-03-08 NOTE — Progress Notes (Signed)
   03/08/23 0900  Psych Admission Type (Psych Patients Only)  Admission Status Voluntary  Psychosocial Assessment  Patient Complaints Irritability  Eye Contact Brief  Facial Expression Flat  Affect Apprehensive  Speech Logical/coherent  Interaction Guarded  Motor Activity Other (Comment) (wnl)  Appearance/Hygiene Unremarkable  Behavior Characteristics Irritable  Mood Preoccupied  Thought Process  Coherency WDL  Content WDL  Delusions None reported or observed  Perception WDL  Hallucination None reported or observed  Judgment Impaired  Confusion None  Danger to Self  Current suicidal ideation? Denies  Danger to Others  Danger to Others None reported or observed

## 2023-03-08 NOTE — Plan of Care (Signed)
  Problem: Physical Regulation: Goal: Ability to maintain clinical measurements within normal limits will improve Outcome: Progressing   Problem: Safety: Goal: Periods of time without injury will increase Outcome: Progressing  Patient has been pacing this evening compliant with medications. No adverse effects noted. Stated he hope to be feeling better in the morning after getting a good night sleep from the new Seroquel he started tonight. Q 15 minutes safety checks ongoing. Patient remains safe.
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
## 2023-03-08 NOTE — [facility] Group Notes (Signed)
Adult Psychoeducational Group Note  Date:  03/08/2023 Time:  9:55 AM  Group Topic/Focus:  Goals Group:   The focus of this group is to help patients establish daily goals to achieve during treatment and discuss how the patient can incorporate goal setting into their daily lives to aide in recovery. Orientation:   The focus of this group is to educate the patient on the purpose and policies of crisis stabilization and provide a format to answer questions about their admission.  The group details unit policies and expectations of patients while admitted.  Participation Level:  Active  Participation Quality:  Attentive  Affect:  Appropriate  Cognitive:  Alert  Insight: Appropriate  Engagement in Group:  Engaged  Modes of Intervention:  Discussion  Additional Comments:  Patient attended and participated in the goals group.  [name] 03/08/2023, 9:55 AM

## 2023-03-10 ENCOUNTER — Telehealth (HOSPITAL_COMMUNITY): Payer: Self-pay

## 2023-03-10 NOTE — Telephone Encounter (Signed)
Therapist calls Anthony [name] and he answered.  Therapist confirmed his identity by requesting two identifiers.  Anthony [name] says he is doing so much better at this time and wants to re-start CD-IOP.  Therapist informs Anthony [name] it is fine to start tomorrow (03-11-23).  Anthony Eisenmenger LMFT, LCAS 03-10-23

## 2023-03-11 ENCOUNTER — Ambulatory Visit (INDEPENDENT_AMBULATORY_CARE_PROVIDER_SITE_OTHER): Payer: 59

## 2023-03-11 ENCOUNTER — Encounter (HOSPITAL_COMMUNITY): Payer: Self-pay

## 2023-03-11 DIAGNOSIS — F418 Other specified anxiety disorders: Secondary | ICD-10-CM

## 2023-03-11 DIAGNOSIS — Z5329 Procedure and treatment not carried out because of patient's decision for other reasons: Secondary | ICD-10-CM

## 2023-03-11 DIAGNOSIS — Z811 Family history of alcohol abuse and dependence: Secondary | ICD-10-CM

## 2023-03-11 DIAGNOSIS — F341 Dysthymic disorder: Secondary | ICD-10-CM

## 2023-03-11 DIAGNOSIS — Z6372 Alcoholism and drug addiction in family: Secondary | ICD-10-CM

## 2023-03-11 DIAGNOSIS — F39 Unspecified mood [affective] disorder: Secondary | ICD-10-CM

## 2023-03-11 DIAGNOSIS — F1020 Alcohol dependence, uncomplicated: Secondary | ICD-10-CM | POA: Diagnosis not present

## 2023-03-11 DIAGNOSIS — Z566 Other physical and mental strain related to work: Secondary | ICD-10-CM

## 2023-03-11 DIAGNOSIS — F10931 Alcohol use, unspecified with withdrawal delirium: Secondary | ICD-10-CM

## 2023-03-11 NOTE — Progress Notes (Signed)
[facility] Follow-up Outpatient CDIOP Date: 03/11/2023  Admission Date:7/10 /2024  Sobriety date:02/25/2023  Subjective: "I dont remember anything"  HPI : CD IOP Provider FU Pt is seen today after returning to IOP S/P admission for DTS and subsequent Psychiatric hospitalization > He has no memory of the DTs hospitalization.  Physician Discharge Summary    Patient: Anthony [name] MRN: [id number] DOB: 1966/03/27  Admit date:     02/25/2023  Discharge date: 03/04/23  Discharge Physician: Loyce Dys    PCP: Patient, No Pcp Per    Recommendations at discharge:  Follow-up with psychiatry   Discharge Diagnoses: EtOH withdrawal Metabolic encephalopathy- resolved History of depression Hypertension Hematuria-secondary traumatic Foley insertion-resolved Hyponatremia, hypokalemia  Hospital Course: 57 year old with long history of EtOH abuse coupled with anxiety, depression, suicidal ideation and major depression. Originally presents to behavioral health center for alcohol withdrawal but due to tachycardia and hallucinations was transferred to [facility] emergency department.  Patient was initially managed with Ativan and then Precedex drip which has now been weaned off.  Patient has been refused for psych and has been deemed to meet inpatient psych criteria.  Patient is therefore being discharged to inpatient psych.   Physician Discharge Summary Note   Patient:  Anthony [name] is an 57 y.o., male MRN:  [id number] DOB:  11/14/1965 Patient phone:  [phone] (home)          Patient address:   [address] [place] [address],          Total Time spent with patient: 30 minutes   Date of Admission:  03/04/2023 Date of Discharge:   03/08/2023   Reason for Admission:   Anthony [name] is a 57 year old Caucasian male with prior psychiatric history significant for depression and alcohol use disorder, who presents voluntarily [facility] from [name], ED for worsening  depression and alcoholism and seeking alcohol detox.  At the Hospital, patient became increasingly medically unstable, confused, disoriented to self and situation, obtunded with persistent tremor. After medical evaluation/stabilization & clearance, he was transferred to the [facility] for further psychiatric evaluation & treatments.    Principal Problem: MDD (major depressive disorder), recurrent episode, severe (HCC) Discharge Diagnoses: Principal Problem:   MDD (major depressive disorder), recurrent episode, severe (HCC)   Patient's psychiatric medications were adjusted on admission:  --Resume Wellbutrin 75 mg p.o. daily for depression (patient denies history of alcohol withdrawal seizures or epilepsy) --Continue naltrexone tablet 50 mg p.o. daily for treatment of alcohol dependence --Continue trazodone tablet 50 mg p.o. nightly as needed for insomnia --Continue hydroxyzine tablets 25 mg p.o. 3 times daily as needed for anxiety   During the hospitalization, other adjustments were made to the patient's psychiatric medication regimen:  Discontinued trazodone tablet 50 mg p.o. q. nightly as needed for insomnia Start Seroquel 50 mg p.o. daily and at bedtime for depression and insomnia  Counselor's report:Pending for return to group today  Review of Systems: Psychiatric: Agitation: No Hallucination: No Depressed Mood: Has begun taking meds regularly Insomnia: Rx Seroquel Hypersomnia: No Altered Concentration: No Feels Worthless: Aware his drinking is about to cost him his job and his family and his life Grandiose Ideas: No Belief In Special Powers: No New/Increased Substance Abuse: No Compulsions: Not currently  Neurologic: Headache: No Seizure: No Paresthesias: No  Current Medications: buPROPion 75 MG tablet Commonly known as: WELLBUTRIN Take 1 tablet (75 mg total) by mouth every morning.  docusate sodium 100 MG capsule Commonly known as:  COLACE Take 1 capsule (100 mg total) by mouth  2 (two) times daily as needed for mild constipation.  folic acid 1 MG tablet Commonly known as: FOLVITE Take 1 tablet (1 mg total) by mouth daily.  hydrOXYzine 25 MG tablet Commonly known as: ATARAX Take 1 tablet (25 mg total) by mouth 3 (three) times daily as needed for anxiety.  losartan 25 MG tablet Commonly known as: COZAAR Take 1 tablet (25 mg total) by mouth daily.  multivitamin with minerals Tabs tablet Take 1 tablet by mouth daily.  naltrexone 50 MG tablet Commonly known as: DEPADE Take 1 tablet (50 mg total) by mouth daily.  polyethylene glycol 17 g packet Commonly known as: MIRALAX / GLYCOLAX Take 17 g by mouth daily as needed for moderate constipation.  QUEtiapine 50 MG tablet Commonly known as: SEROQUEL Take 1 tablet (50 mg total) by mouth at bedtime.  thiamine 100 MG tablet Commonly known as: Vitamin B-1 Take 1 tablet (100 mg total) by mouth daily.    Mental Status Examination  Appearance:Casual /neat Alert: Yes Attention: good  Cooperative: Yes Eye Contact: Good Speech: Clear and coherent, rate WNL Psychomotor Activity: Normal Memory:Absent for DTs Concentration/Attention: Normal/intact Oriented: person, place, time/date and situation Mood: Euthymic Affect: Appropriate and Congruent Thought Processes and Associations: Coherent and Intact Fund of Knowledge: Good Thought Content: WDL Insight:Limited Judgement:Impaired  UDS:NA  PDMP:000  Diagnosis:  Alcohol use disorder, severe, dependence (HCC) DTs (delirium tremens) (HCC) Unspecified mood (affective) disorder (HCC) Procedure and treatment not carried out because of patient's decision for other reasons Dysfunctional family due to alcoholism Family history of alcoholism in father Early onset dysthymia Anxious depression Stressful job  Assessment:A brush with death-seems to have changed his attitude toward treatment  Treatment Plan:Per Admission and Counselors Pt asked about Antabuse but am not willing  to try him on this medication due danger outweighing benefit-persuaded him to start Baclofen TID and given copy of [facility] FU 1 week -sooner if needed [name], PA-CPatient ID: Anthony [name], male   DOB: [birth year], 56 y.o.   MRN: [id number]

## 2023-03-11 NOTE — Progress Notes (Signed)
   Daily Group Progress Note   Program: CD IOP     Group Time: 9 a.m. to 12 p.m.    Type of Therapy: Process and Psychoeducational    Topic: The therapist checks in with group members, assesses for SI/HI/psychosis and overall level of functioning. The therapist inquires about sobriety date and number of community support meetings attended since last session.  Therapists facilitate discussion on the following issues:  principles of behavior modification as it relates to mental health and addiction including positive reinforcement, negative reinforcement, punishment and the role of rewards, issues in early recovery focusing on what occurs in the brain, how to main momentum in early recovery, development of amenia after one has been in recovery for a while, specifically forgetting the pain of active addiction and how doing service work can help one to "remember". Therapists distribute the [employer] model test entitled "Be Smart, not Strong and prompt discussion on what can be done to increase one's "recovery IQ".   Summary: [name] rates his depression as a "2" and anxiety as a "5". He identifies his emotions as "uncomfortable and embarrassed". He links these emotions to his recent relapse. [name] discusses the course of treatment with his relapse, saying he went home and drank after the last IOP group. He says he went to [facility] a couple of days later and was sent to the ED and then was released with a BAD of .30.  [name] says he walked home in the rain.  When he arrived home, he began drinking again.  His wife took him back to the [facility] and was sent to the ED and admitted to ICU.  After his ICU stay he went to the behavioral health hospital and stayed approximately a week and was discharged. [name] says the only thing that was of help at the [facility] was a man from the [place] meeting who came to speak.  [name] says he is meeting this man today and he knows a sponsor in his area that he is going to  introduce Bill to.  Bill's score on the Be Smart, Not Strong test was a 24.  Progress Towards Goals: [name] has a new sobriety date (02-25-23) while in detox   UDS collected: Yes Results: none   AA/NA attended?: no   Sponsor: is getting a different one   [name], MS LMFT, [address] [name], [facility], LCAS 03-11-23

## 2023-03-13 ENCOUNTER — Encounter (HOSPITAL_COMMUNITY): Payer: Self-pay

## 2023-03-13 ENCOUNTER — Ambulatory Visit (HOSPITAL_COMMUNITY): Payer: 59

## 2023-03-16 ENCOUNTER — Ambulatory Visit (INDEPENDENT_AMBULATORY_CARE_PROVIDER_SITE_OTHER): Payer: 59 | Admitting: Licensed Clinical Social Worker

## 2023-03-16 DIAGNOSIS — F39 Unspecified mood [affective] disorder: Secondary | ICD-10-CM | POA: Diagnosis not present

## 2023-03-16 DIAGNOSIS — F1020 Alcohol dependence, uncomplicated: Secondary | ICD-10-CM

## 2023-03-16 NOTE — Progress Notes (Signed)
   Daily Group Progress Note   Program: CD IOP     Group Time: 9 a.m. to 12 p.m.    Type of Therapy: Process and Psychoeducational    Topic: The therapist checks in with group members, assesses for SI/HI/psychosis and overall level of functioning. The therapist inquires about sobriety date and number of community support meetings attended since last session.   Therapists pass out the [employer] reading on "Rebellion".  Therapists facilitate discussion on Rebellion as it relates to addiction and early recovery through elaborating on the definition (stuck in self will), providing and eliciting examples of rebellion in this context. Therapists discuss how people who suffer from addiction may seek information from a therapist, sponsor or others with years in recovery on how to get better,however may refuse to follow through.Therapists discuss the importance of addicts to learn to let go and become humble when the information being shared from someone who has experienced success because the person in early recovery has not experienced that level of success. Therapists discuss when rebellion can be an asset against the disease of addiction.  Therapist present examples of this including "don't listen to your disease" because if you do what your disease is telling you, then you will not experience any degree of recovery. Therapists discuss the necessity of being "selfish" in recovery, that is making recovery one's number one priority.  Summary: Anthony [name] rates his depression as a "1" and anxiety as a "2"  Anthony [name] identifies his emotions as "relaxed and hopeful". In response to the [employer] reading on "Rebellion", Anthony [name] says that this pretty much sums him up.  Anthony [name] says he saw this from the recent relapse he had.  Anthony [name] shares his family went to [facility] over an extended weekend to an antique show. He says there was alcohol there but he did not feel triggered to drink.  Therapist discuss the wisdom of taking said trip given  the criticalness of his recent hospital stay related to alcohol use. Anthony [name] says verbalizes that two vacations this year resulted in relapse.  Therapist discusses the importance of attending meetings and obtaining a sponsor.   Progress Towards Goals: Reports no use of alcohol or drugs   UDS collected: Yes Results: none   AA/NA attended?: no   Sponsor: Plans to call a potential sponsor   [name], MS LMFT, [address] [name], [facility], LCAS 03-16-23
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
## 2023-03-17 ENCOUNTER — Telehealth (HOSPITAL_COMMUNITY): Payer: Self-pay | Admitting: Licensed Clinical Social Worker

## 2023-03-17 NOTE — Telephone Encounter (Signed)
The therapist receives a call from Bill's wife confirming her identity via two identifiers. She thanks this therapist for meeting with them yesterday but adds that it apparently did not do any good in that [name] is now angry at her and giving her the silent treatment.   The therapist observes that the things his wife said in yesterday's meeting were all true as confirmed by [name] and she relayed them in a respectful manner. Thus, the therapist discloses his belief that [name] has no reason to be angry at anyone unless it is himself as he must take accountability for his actions when drinking.  As his wife is trying to get him to talk to her, the therapist questions what the reason is that she is always chasing him versus setting some limits. The therapist encourages her to stop trying to get him to talk and to respond in kind if she is getting no response from him.  Myrna Blazer, MA, LCSW, [facility], LCAS 03/17/2023

## 2023-03-18 ENCOUNTER — Telehealth (HOSPITAL_COMMUNITY): Payer: Self-pay

## 2023-03-18 ENCOUNTER — Ambulatory Visit (INDEPENDENT_AMBULATORY_CARE_PROVIDER_SITE_OTHER): Payer: 59 | Admitting: Medical

## 2023-03-18 ENCOUNTER — Telehealth (HOSPITAL_COMMUNITY): Payer: Self-pay | Admitting: Licensed Clinical Social Worker

## 2023-03-18 ENCOUNTER — Encounter (HOSPITAL_COMMUNITY): Payer: Self-pay | Admitting: Medical

## 2023-03-18 DIAGNOSIS — F10931 Alcohol use, unspecified with withdrawal delirium: Secondary | ICD-10-CM

## 2023-03-18 DIAGNOSIS — Z6372 Alcoholism and drug addiction in family: Secondary | ICD-10-CM

## 2023-03-18 DIAGNOSIS — F418 Other specified anxiety disorders: Secondary | ICD-10-CM

## 2023-03-18 DIAGNOSIS — Z5329 Procedure and treatment not carried out because of patient's decision for other reasons: Secondary | ICD-10-CM

## 2023-03-18 DIAGNOSIS — Z811 Family history of alcohol abuse and dependence: Secondary | ICD-10-CM

## 2023-03-18 DIAGNOSIS — T1490XA Injury, unspecified, initial encounter: Secondary | ICD-10-CM

## 2023-03-18 DIAGNOSIS — F1020 Alcohol dependence, uncomplicated: Secondary | ICD-10-CM

## 2023-03-18 DIAGNOSIS — F39 Unspecified mood [affective] disorder: Secondary | ICD-10-CM

## 2023-03-18 DIAGNOSIS — F341 Dysthymic disorder: Secondary | ICD-10-CM

## 2023-03-18 DIAGNOSIS — F10231 Alcohol dependence with withdrawal delirium: Secondary | ICD-10-CM | POA: Diagnosis not present

## 2023-03-18 DIAGNOSIS — F4312 Post-traumatic stress disorder, chronic: Secondary | ICD-10-CM

## 2023-03-18 DIAGNOSIS — Z566 Other physical and mental strain related to work: Secondary | ICD-10-CM

## 2023-03-18 NOTE — Telephone Encounter (Signed)
This therapist received a call from Anthony [name]'s wife.  Patient's identity was verified by two identifiers.  Anthony [name] says that Anthony [name] has been silent and has not engaged her in any way since they had the conjoint session with [name], LCSW, [facility], LCAS.    Anthony [name] says that she believes Anthony [name] in only interested in a relationship with their son.  She says when she has tried to talk to him, he comments, "I'm a Bango and that's how we are".  Anthony [name] says she does not think that Anthony [name] wants to break the cycle he is in with addiction or how he relates to her.  Anthony [name] asked that [name] call her as he had the conjoint session with she and [place].  This therapist says she will communicate the message.  [name], MS, LMFT, LCAS 03-18-23

## 2023-03-18 NOTE — Telephone Encounter (Signed)
The therapist calls [name] confirming his identity via two identifiers and asking to meet with Bill on 03/20/23 after group one-on-one.  Myrna Blazer, MA, LCSW, [facility], LCAS 03/18/2023
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
## 2023-03-18 NOTE — Progress Notes (Signed)
   Daily Group Progress Note   Program: CD IOP     Group Time: 9 a.m. to 12 p.m.    Type of Therapy: Process and Psychoeducational    Topic: The therapist checks in with group members, assesses for SI/HI/psychosis and overall level of functioning. The therapist inquires about sobriety date and number of community support meetings attended since last session.   Therapists discuss and prompt discussions on the following topics today: the issue of patients getting mis-diagnosed when a full history is not obtained, for instance mental health diagnoses being assigned when a full addiction history does not delineate whether or not the mental health symptoms occurred when an individual is in sobriety or if they are being given such diagnoses when they are in active addiction; Discussed the importance of understanding the family member's perception of their loved one's disease of addiction and issues that have arisen as a result of being in active action.  Reframed loved one's "nagging" the person in active addiction to a caring stance. Discussed the importance of looking at what needs to happen once the person is in recovery. Discussed "rolling with addiction" as a helpful concept to emulate when one is dealing with family or friends that continue to bring up behaviors that were done when one is in active addiction; Discussed readiness to change, including the reasons to stop using, both negative and positive.   Summary: [name] rates his depression as a "3" and anxiety as a "3". He reports the same sobriety date.  [name] says he did not realize the family meeting he and his wife attended yesterday  was going to be about their marriage. Therapist discusses how addiction affects one's relationships and the importance of getting family member's perception. [name] says he understands but pointed out his wife tells multiple people about what is going on with him.  [name] says he realizes what he has inflicted on others  and says that is not easy to deal with. Therapist asks how [name] can move forward from this point. Bill replies that no amount of pain can make you stop if you don't want to stop. Therapist asks if he wants to stop and if so what has he learned that he can use moving forward/  [name] responds he wants to stop and be there for his son and learn to have fun. Therapist discusses meeting attendance and asks him to commit to 90/90 staying the meetings do not all have to be in person. Therapist also asks [name] to either get in touch with his formally potential sponsor or get a new one, if he desires.  Bill did not make a commitment on this issue.  Progress Towards Goals: Reports no use of alcohol or drugs   UDS collected: Yes Results: negative   AA/NA attended?: no   Sponsor: None   [name], MS LMFT, [address] [name], [facility], LCAS 03-18-23

## 2023-03-18 NOTE — Telephone Encounter (Signed)
The therapist returns Anthony [name]'s wife's call confirming her identity via two identifiers. She says that Anthony [name] is still not talking to her and that when she asked him to intervene concerning a situation with their son that he did not do so. Now, she has received a call that their son received in school suspension. She says that Anthony [name] and Anthony [name]'s mother suggest that Anthony [name] is the problem. Anthony [name] questions what she has done such that Anthony [name] is not putting in any effort to save the marriage. She asks how Anthony [name] was in today's group.  The therapist discloses his belief that Anthony [name] is not responsible for causing Anthony [name]'s alcoholism and that she is powerless to make him drink or get him to stop drinking. He also observes that Anthony [name]'s mother was married to Anthony [name]'s father who was an alcoholic who cheated on her.  The therapist encourages Anthony [name] to find a new Counselor now that her old one left and continue with her psychiatrist and seek healthy support via [place] and to do what is necessary to remove her and her son from this situation if Anthony [name] does not engage in recovery.   The therapist explains to Anthony [name] that there is no guarantee with any person who remains in active addiction that the end result may not end in death adding that Anthony [name] was near death a week ago; however, it has not impacted his meeting attendance, getting a Sponsor, etcetera.  Myrna Blazer, MA, LCSW, [facility], LCAS 03/18/2023

## 2023-03-20 ENCOUNTER — Ambulatory Visit (INDEPENDENT_AMBULATORY_CARE_PROVIDER_SITE_OTHER): Payer: 59

## 2023-03-20 DIAGNOSIS — F39 Unspecified mood [affective] disorder: Secondary | ICD-10-CM

## 2023-03-20 DIAGNOSIS — F1020 Alcohol dependence, uncomplicated: Secondary | ICD-10-CM

## 2023-03-20 NOTE — Progress Notes (Addendum)
   Daily Group Progress Note   Program: CD IOP     Group Time: 9 a.m. to 12 p.m.    Type of Therapy: Process and Psychoeducational    Topic: The therapist checks in with group members, assesses for SI/HI/psychosis and overall level of functioning. The therapist inquires about sobriety date and number of community support meetings attended since last session.   Therapists welcome new member to the group. Therapists discuss and prompts discussion about the following cognitive distortions and their role in impeding progress in treatment: All or nothing, over generalizations, mental filters, discounting, jumping to conclusions, magnification, emotional reasoning, should statements, labeling and personalization and blame.   Therapists discuss the role of boredom in Post Acute Withdrawal Syndrome  and how to deal with it. Therapists also discuss the importance of keeping the momentum and how to to about this.  Summary: Anthony [name] rates his depression as a "3" and his anxiety as a "3".  He identifies  his emotions as "tired and hopeful". Anthony [name] gives the same sobriety date. He says he has attended zoom meetings the last couple of nights and this was not as difficult as he thought it would be.  He says he went in person last night.  Anthony [name] says he wants to find a meeting where they speak about the Big Book. Anthony [name] says he finally called a man that will be his temporary sponsor and is planning to meet him in person.  Progress Towards Goals: No alcohol or drugs since last meeting   UDS collected: no  Results: none   AA/NA attended?: Yes   Sponsor: is going to meet a temporary sponsor   [name], MS LMFT, [address] [name], [facility], LCAS 03-20-23

## 2023-03-23 ENCOUNTER — Ambulatory Visit (INDEPENDENT_AMBULATORY_CARE_PROVIDER_SITE_OTHER): Payer: 59

## 2023-03-23 DIAGNOSIS — F1020 Alcohol dependence, uncomplicated: Secondary | ICD-10-CM | POA: Diagnosis not present

## 2023-03-23 DIAGNOSIS — F39 Unspecified mood [affective] disorder: Secondary | ICD-10-CM

## 2023-03-23 NOTE — Progress Notes (Signed)
Daily Group Progress Note   Program: CD IOP     Individual  Time: 12 p.m. to 1:20 p.m.    Type of Therapy: Solution-Focused  The therapist meets with Chayden concerning his marital problems and inability to let go of resentment towards his wife for having told people about Bill's drinking. Bill views his wife's actions as needing "to be right;" however, this therapist suggests that his wife seems to be trying to prove to others and mostly herself that Bill's drinking is not due to the fact that she not lovable enough to him such that he will stop drinking. [name] is able to see the validity in this observation based on comments his wife has made in the past to this effect.   The therapist informs [name] that since he started group that his marital discord has been a constant source of distress to him and thus an ongoing threat to his sobriety. Thus, the therapist challenges [name] concerning what efforts he is going to make to resolve this issue. [name] admits that he has not shown his wife much in the way of love and attention.  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 [name] says that he knows that he can die from his drinking; however, this therapist informs [name] that his actions immediately after leaving ICU are not congruent with his believing what he is telling this therapist about being at risk of dying.  Myrna Blazer, MA, LCSW, [facility], LCAS 03/23/2023

## 2023-03-23 NOTE — Progress Notes (Signed)
   Daily Group Progress Note   Program: CD IOP     Group Time: 9 a.m. to 12 p.m.    Type of Therapy: Process and Psychoeducational    Topic: The therapist checks in with group members, assesses for SI/HI/psychosis and overall level of functioning. The therapist inquires about sobriety date and number of community support meetings attended since last session.  Therapists discuss addiction as being a chronic and progressive disease and facilitate discussion on the following subjects: what does "hitting the bottom mean", jails, institutions and death as real possibilities of the disease if it is not addressed, various health conditions and diseases that can increase the severity of addiction and it's progression, the affects of aging regarding the body not being able to bounce back from relapses as it may have previously been able to, individuals with addiction thinking they are outside the realms of physics /magical thinking.   Summary: [name] rates his depression as a "1" and his anxiety as a "2".    He identifies his emotions as "Calm and bothered".  [name] says he has attended 3 meetings since last IOP, but had to reschedule meeting with sponsor and is supposed to see him tonight.  [name] says he had a big weekend.  He says he went to an [employer] in [place] which was fun. [name] says he took his wife hiking for 9 miles and then went to [place] to have a nice dinner and provided her an opportunity to talk and listen to her concerns.  [name] says he did have a drinking dream which was frightening and he felt thankful it was not true when he woke up.  He says he grateful he does not have many of these.  Progress Towards Goals: No alcohol or drugs since last meeting   UDS collected: Yes Results: positive for Phetharbital from detox   AA/NA attended?: Yes   Sponsor: is going to meet a temporary sponsor   [name], MS LMFT, [address] [name], [facility], LCAS 03-23-23

## 2023-03-25 ENCOUNTER — Ambulatory Visit (INDEPENDENT_AMBULATORY_CARE_PROVIDER_SITE_OTHER): Payer: 59

## 2023-03-25 ENCOUNTER — Encounter (HOSPITAL_COMMUNITY): Payer: Self-pay | Admitting: Medical

## 2023-03-25 ENCOUNTER — Telehealth (HOSPITAL_COMMUNITY): Payer: Self-pay | Admitting: Licensed Clinical Social Worker

## 2023-03-25 DIAGNOSIS — F1020 Alcohol dependence, uncomplicated: Secondary | ICD-10-CM | POA: Diagnosis not present

## 2023-03-25 DIAGNOSIS — F39 Unspecified mood [affective] disorder: Secondary | ICD-10-CM | POA: Diagnosis not present

## 2023-03-25 NOTE — Progress Notes (Signed)
   Daily Group Progress Note   Program: CD IOP     Group Time: 10:30 a.m. to 12 p.m.    Type of Therapy: Process and Psychoeducational    Topic: The therapist checks in with group members, assesses for SI/HI/psychosis and overall level of functioning. The therapist inquires about sobriety date and number of community support meetings attended since last session.   Therapists explain what the DSM, and how to use to criteria to determine if one has a substance use disorder and the degree of the severity.  Therapists discussed the areas that are evaluated to explore the impact the use of substance has had on the areas: 1) Impaired control, 2) Social Problems. 3) Risky use, 4) Physical Dependence Therapists had each person to identify what criteria they met. The severity levels of mild, moderate and severe were discussed.  Therapists prompts discussion on the groups understanding of the diagnosis. Therapists present the ASAM criteria and review how specific criteria indicate different levels of care. Therapists discuss how medical treatments vary based on the severity of the disease or condition and liken it to DSM diagnoses, particularly Substance Use.  Therapists discussed how the most aggressive treatments are required for conditions with high severity. Therapists inquire of the group if they are able to see this connection and evaluate what treatments and assistance they are seeking given the severity of their disease, specifically the number of community meetings they are attending.  Therapists provide the example if one had advance stages of cancer the medical team would not be recommending the exact treatment as they would for someone with a less serious condition.    Summary: Anthony [name] rates his depression as a "2" and his anxiety as a "4". Programmer, applications for running late, as he said school was delayed for 2 hours and he remained home with his son. Anthony [name] reports the same sobriety date.  He says he  attended a meeting.  Anthony [name]'s response to the therapist asking what his take away from today was, he says, "recovery has to match the severity of the disease".  Anthony [name] says he met his temporary sponsor and is building trust and commitment with him.  Anthony [name] says on Thursday evening they are going to work on relapse/recovery.    Progress Towards Goals: No alcohol or drugs since last meeting   UDS collected: no Results: none   AA/NA attended?: Yes   Sponsor: Temporary Sponsor   Anthony Eisenmenger, MS LMFT, [address] [name], [facility], LCAS 03-25-23

## 2023-03-25 NOTE — Telephone Encounter (Signed)
Bill leaves a voicemail saying that school is on a two hour delay so he will not be in until 10:15 a.m.   Myrna Blazer, MA, LCSW, [facility], LCAS 03/25/2023

## 2023-03-27 ENCOUNTER — Ambulatory Visit (INDEPENDENT_AMBULATORY_CARE_PROVIDER_SITE_OTHER): Payer: 59

## 2023-03-27 DIAGNOSIS — F1020 Alcohol dependence, uncomplicated: Secondary | ICD-10-CM

## 2023-03-27 DIAGNOSIS — F39 Unspecified mood [affective] disorder: Secondary | ICD-10-CM

## 2023-03-27 NOTE — Progress Notes (Signed)
   Daily Group Progress Note   Program: CD IOP     Group Time:9:00 a.m. to 12 p.m.    Type of Therapy: Process and Psychoeducational    Topic: The therapist checks in with group members, assesses for SI/HI/psychosis and overall level of functioning. The therapist inquires about sobriety date and number of community support meetings attended since last session.   Therapists discuss the following issues and prompt discussion:  how persons suffering from substance use disorders often suffer from depressive, anxiety, including trauma disorders, administered the PHQ-9 and GAD-7 and explaining severity levels and at what level it is indicated that medication management may be helpful, and how stability these disorders can contribute to remaining sober, explain ACE'S (Adverse Childhood Experience) and the high percentage of children having these experiences before age 76, discuss the long term effects on persons who have had these experiences and the potentiality of them also suffering from substance use disorders, discuss shame being a threat to sobriety and how this is addressed in step 4 which has one making a search of their lives and step 5 which involved telling this to another person, emphasized how sharing such information is beneficial in recovery, as it allows one "to put the bag of bricks down".  Summary: Anthony [name] rates his depression as a "3" and his anxiety as a "2". Anthony [name] identifies his emotion today as "anxious and depressed". Anthony [name] says he attended a "One Day at a Time" meeting and found that meeting helpful. He said he met his temporary sponsor in person. Anthony [name] says his sponsor was encouraging. Anthony [name] says he has felt irritable recently and does not know if his medications need to be adjusted. Therapist discusses PAWS where folks in the first year of recovery or longer may have such symptoms.  Anthony [name] says going to meetings and IOP remind him of the traumatic detox and residential treatment  experiences. Anthony [name] says he has shame over the burden he has put on his wife and son with his addiction.  Therapist explains how unresolved shame can be a threat to sobriety. Anthony [name] says he hopes to have a pleasant weekend as he plans to take his wife to a drag racing event which is something they both enjoy.   Progress Towards Goals: No alcohol or drugs since last meeting   UDS collected: no Results: none   AA/NA attended?: Yes   Sponsor: Temporary Sponsor   [name], MS LMFT, [address] [name], [facility], LCAS 03-25-23

## 2023-03-30 ENCOUNTER — Ambulatory Visit (INDEPENDENT_AMBULATORY_CARE_PROVIDER_SITE_OTHER): Payer: 59

## 2023-03-30 DIAGNOSIS — F39 Unspecified mood [affective] disorder: Secondary | ICD-10-CM

## 2023-03-30 DIAGNOSIS — F1020 Alcohol dependence, uncomplicated: Secondary | ICD-10-CM | POA: Diagnosis not present

## 2023-03-30 NOTE — Progress Notes (Signed)
   Daily Group Progress Note   Program: CD IOP     Group Time:9:00 a.m. to 12 p.m.    Type of Therapy: Process and Psychoeducational    Topic: The therapist checks in with group members, assesses for SI/HI/psychosis and overall level of functioning. The therapist inquires about sobriety date and number of community support meetings attended since last session.   Therapists pass out the [place] reading, "Keeping the Gift". Therapists discuss viewing recovery as a gift and how gifts are precious. Therapists discuss how people who value themselves want to do things that are healthy and how if a person hates themselves, they then participate in activities and practices that are self destructive. Therapists discuss epigenetics, focusing on the impact of intergenerational trauma and addiction.  Therapists discuss the importance of after care with integrated treatment. Therapists also discuss the importance of having a sponsor that has availability that persons in early recovery need.   Summary: [name] rates his depression as a "3" and his anxiety as a "3". He identifies his emotion today as "hopeful".  [name] shares he is really enjoying the [employer] and learning from others.  He has been meeting with his temporary sponsor and asks the therapists if this guy is going to be his permanent sponsor. Therapists encourage Bill to ask the temporary sponsor if that will be the case.   [name] discusses the importance of treatment and notes that a person has to "really want sobriety" to start recovery.  [name] shares that he is reading  the book [name], PA-C gave him entitled Living Sober and finding it helpful.  Progress Towards Goals: Reports No alcohol or drugs.   UDS collected: Yes Results: none   AA/NA attended?: Yes   Sponsor: Temporary Sponsor   [name], MS LMFT, [address] [name], [facility], LCAS 03-30-23

## 2023-04-01 ENCOUNTER — Ambulatory Visit (INDEPENDENT_AMBULATORY_CARE_PROVIDER_SITE_OTHER): Payer: 59 | Admitting: Medical

## 2023-04-01 DIAGNOSIS — Z811 Family history of alcohol abuse and dependence: Secondary | ICD-10-CM

## 2023-04-01 DIAGNOSIS — Z5329 Procedure and treatment not carried out because of patient's decision for other reasons: Secondary | ICD-10-CM

## 2023-04-01 DIAGNOSIS — F39 Unspecified mood [affective] disorder: Secondary | ICD-10-CM

## 2023-04-01 DIAGNOSIS — F1020 Alcohol dependence, uncomplicated: Secondary | ICD-10-CM

## 2023-04-01 DIAGNOSIS — Z6372 Alcoholism and drug addiction in family: Secondary | ICD-10-CM

## 2023-04-01 DIAGNOSIS — F341 Dysthymic disorder: Secondary | ICD-10-CM

## 2023-04-01 DIAGNOSIS — F4312 Post-traumatic stress disorder, chronic: Secondary | ICD-10-CM

## 2023-04-01 DIAGNOSIS — Z566 Other physical and mental strain related to work: Secondary | ICD-10-CM

## 2023-04-01 DIAGNOSIS — T1490XA Injury, unspecified, initial encounter: Secondary | ICD-10-CM

## 2023-04-01 DIAGNOSIS — F10931 Alcohol use, unspecified with withdrawal delirium: Secondary | ICD-10-CM

## 2023-04-01 DIAGNOSIS — F418 Other specified anxiety disorders: Secondary | ICD-10-CM

## 2023-04-01 MED ORDER — LAMOTRIGINE 25 & 50 & 100 MG PO KIT: PACK | ORAL | 0 refills | Status: DC

## 2023-04-01 MED ORDER — NALTREXONE HCL 50 MG PO TABS: 50.0000 mg | ORAL_TABLET | Freq: Every day | ORAL | 0 refills | Status: AC

## 2023-04-01 MED ORDER — BUPROPION HCL ER (XL) 150 MG PO TB24
150.0000 mg | ORAL_TABLET | ORAL | 2 refills | Status: AC
Start: 2023-04-01 — End: 2024-03-31

## 2023-04-01 MED ORDER — ADULT MULTIVITAMIN W/MINERALS CH: 1.0000 | ORAL_TABLET | Freq: Every day | ORAL | 0 refills | Status: AC

## 2023-04-01 MED ORDER — QUETIAPINE FUMARATE 50 MG PO TABS: 50.0000 mg | ORAL_TABLET | Freq: Every day | ORAL | 0 refills | Status: DC

## 2023-04-01 NOTE — Progress Notes (Signed)
   Daily Group Progress Note   Program: CD IOP     Group Time:9:00 a.m. to 12 p.m.    Type of Therapy: Process and Psychoeducational    Topic: The therapist checks in with group members, assesses for SI/HI/psychosis and overall level of functioning. The therapist inquires about sobriety date and number of community support meetings attended since last session.   Therapists present and facilitate discussion on the Spiritual Platform as developed Bernardo Heater , identifying the four pillars of spirituality those being 1) relationship, 2) risk, 3) choice and 4) Awe.  Therapists discuss these pillars as they are affected by the disease of addiction. Therapists asked members take away from this.   Therapists discuss the following issues: the importance developing self-compassion to combat negative self talk; listening to others who have had success with recovery and adhering to their suggestions, how people in active addiction have difficulty with developing healthy relationships; the power of telling another person whether it be your sponsor or therapist as a step toward learning to trust which is necessary to establish relationships as, people with addiction often isolate,; how the twelve steps is about learning to develop healthy relationships with the human race; the importance realizing innate trust allows [place] to treat people well enough that they trust you as how developing such trust is necessary to the survival of the species vs needing a belief in a punitive being the necessary motivation for treating others.    Progress Towards Goals: Reports No alcohol or drugs.   UDS collected: No  Results: none   AA/NA attended?: Yes   Sponsor: yes   [name], MS LMFT, [address] [name], [facility], LCAS 04-01-23

## 2023-04-03 ENCOUNTER — Ambulatory Visit (INDEPENDENT_AMBULATORY_CARE_PROVIDER_SITE_OTHER): Payer: 59

## 2023-04-03 DIAGNOSIS — F39 Unspecified mood [affective] disorder: Secondary | ICD-10-CM

## 2023-04-03 DIAGNOSIS — F1020 Alcohol dependence, uncomplicated: Secondary | ICD-10-CM | POA: Diagnosis not present

## 2023-04-03 NOTE — Progress Notes (Signed)
   Daily Group Progress Note   Program: CD IOP     Group Time:9:00 a.m. to 12 p.m.    Type of Therapy: Process and Psychoeducational    Topic: The therapist checks in with group members, assesses for SI/HI/psychosis and overall level of functioning. The therapist inquires about sobriety date and number of community support meetings attended since last session.   Therapist presents "Unhelpful Thinking Styles and facilitates discussion on how they affect the addiction cycle. The styles are as follows:  All or Nothing Thinking, Mental Filter, Jumping to conclusions, Emotional Reasoning, Labeling, Over generalizing, Disqualifying the Positive, Magnification (catastrophism and Minimization, Should/Must, and Personalization)  Therapist discusses addiction as a disease giving a description of how addiction impact the brain. Therapist also discusses the DBT mind states of reasonable mind, wise mind and emotion mind as a way of processing and replacing unhelpful thoughts.   Summary: Anthony [name] rates his depression as a "3" and his anxiety as a "4". Bill reports the same sobriety date.  He says he is attending zoom meetings and checking in daily with his sponsor.  In response to the unhelpful thinking styles presentation, Anthony [name] shares that they are difficult to implement as he feels his past mistakes are coming back to haunt him.  Anthony [name] says in the past he has journaled and that was of help. Anthony [name] shares he has other experiences of negative thinking as demonstrated by these thoughts: "if thinking something bad is going to happen, it usually does. Anthony [name] says if he did not have his wife and son checking in with him, he is not sure where he would be. Therapist discusses the importance of continuing to develop supports outside of his immediate family.   Progress Towards Goals: Reports No alcohol or drugs.   UDS collected: No Results: none   AA/NA attended?: Yes   Sponsor:  Yes   [name], MS LMFT, LCAS [address]  [name], [facility], LCAS 04-03-23

## 2023-04-06 ENCOUNTER — Encounter (HOSPITAL_COMMUNITY): Payer: Self-pay | Admitting: Medical

## 2023-04-06 ENCOUNTER — Ambulatory Visit (INDEPENDENT_AMBULATORY_CARE_PROVIDER_SITE_OTHER): Payer: 59

## 2023-04-06 DIAGNOSIS — F1020 Alcohol dependence, uncomplicated: Secondary | ICD-10-CM | POA: Diagnosis not present

## 2023-04-06 DIAGNOSIS — F39 Unspecified mood [affective] disorder: Secondary | ICD-10-CM

## 2023-04-06 NOTE — Progress Notes (Signed)
   Daily Group Progress Note   Program: CD IOP     Group Time: 10:00 a.m. to 12 p.m.    Type of Therapy: Process and Psychoeducational    Topic: The therapist checks in with group members, assesses for SI/HI/psychosis and overall level of functioning. The therapist inquires about sobriety date and number of community support meetings attended since last session.   Therapists presented a video on Post Acute Withdrawal Syndrome.  Therapists discuss symptoms common during the Post Acute Withdrawal Syndrome and prompt discussion for any questions group members may have.   Therapists present the [employer] and discuss stages of the progression of the disease of addiction. Therapists discussion meditation and how it is helpful with quieting the chatter in one's mind  including chatter that is often negative.      Summary: Anthony [name] rates his depression as a "2" and his anxiety as a "4". Anthony [name] reports the same sobriety date.  He says he is attending zoom meetings and checking in daily with his sponsor.  Anthony [name] discusses how his past is coming back to haunt him.  He says he can't discuss it but does acknowledge it does involve his wife. Anthony [name] is hesitant in scheduling another appointment for he and his wife to see Myrna Blazer.  Anthony [name] says his emotion today has been anxious and he attributes that to having to run around, going to the dentist this morning and taking his son to school. Anthony [name] says he did have some cravings but did not call his sponsor because he did not want to inconvenient the sponsor. Anthony [name] say that is why he does not ask the sponsor if he can be his permanent sponsor.  Anthony [name] identified with being in the middle of the curve of addiction with his age.  He says his take away from today is that he is where someone his age would expect to be in the recovery process.   Progress Towards Goals: Reports No alcohol or drugs.   UDS collected: Yes  Results: none   AA/NA attended?: Yes   Sponsor:  Yes    Anthony Eisenmenger, MS LMFT, LCAS Anthony [name] Garrot, LCSW, [facility], LCAS 04-06-23

## 2023-04-08 ENCOUNTER — Ambulatory Visit (INDEPENDENT_AMBULATORY_CARE_PROVIDER_SITE_OTHER): Payer: 59

## 2023-04-08 DIAGNOSIS — F39 Unspecified mood [affective] disorder: Secondary | ICD-10-CM

## 2023-04-08 DIAGNOSIS — F1020 Alcohol dependence, uncomplicated: Secondary | ICD-10-CM

## 2023-04-08 NOTE — Progress Notes (Signed)
   Daily Group Progress Note   Program: CD IOP     Group Time: 10:00 a.m. to 12 p.m.    Type of Therapy: Process and Psychoeducational    Topic: The therapist checks in with group members, assesses for SI/HI/psychosis and overall level of functioning. The therapist inquires about sobriety date and number of community support meetings attended since last session.    Therapists discuss the following topics today: how MAT can be helpful with substance Use Disorders, including the anti-craving effects and how they can be a safety net of not having to wrestle with the cravings; Shaming attacking exercises by Caleen Essex, as many people suffering from addiction struggle with addiction being due to character and moral flaws; showed Delbert Boone's video on the psychology of addiction and prompts discussion on how there is no difference between "hard" and "soft" drugs as many people think; discusses there is no "special" addicts, in order words someone is not better or stand apart because they use or do not use any particular drug and how addicts are "garden variety"; prompt discussion on what to look for in a sponsor.     Summary: [name] rates his depression as a "2" and his anxiety as a "2". Bill reports the same sobriety date.  He says he is attending zoom meetings and checking in daily with his sponsor.  [name] says he is doing 90/90 [employer] and has only missed 1 or 2 thus far.  [name] says he is still hesitant to ask his sponsor if he will be his permanent sponsor. Therapist asks what he is working on with his temporary sponsor.  [name] replies that they are not working on steps, rather he is checking in with his sponsor on a daily basis.  [name] says with attending meetings on a regular basis he is avoiding complacency.   [name] says his take away from group today is that he now knows there is no line between "hard and soft" drugs, as presented by Orlin Hilding..    Progress Towards Goals: Reports  No alcohol or drugs.   UDS collected: no  Results: none   AA/NA attended?: Yes   Sponsor:  Yes   [name], MS LMFT, LCAS Bill Garrot, LCSW, [facility], LCAS 04-08-23

## 2023-04-09 NOTE — Progress Notes (Signed)
Program: CD IOP     Individual Time: 1:15 p.m. to 2:30 p.m.  The therapist meets with Anthony [name] to address his recent trigger to drink this past weekend due to something from his past to which he alluded to in group. Anthony [name] informs this therapist that he has not told anyone else about this thing and declines to do so today noting that once a person tells one person a secret that it is no longer a secret. The therapist questions how Anthony [name] is going to deal with this trigger and when he will talk to someone with Anthony [name] saying that he will do so when he does his 4th Step. The therapist observes that Anthony [name] has continued with his Temporary Sponsor which has allowed check-ins with no step work questioning if Anthony [name] may not be delaying working on steps.   Anthony [name] says that he also does not want to tell this therapist about whatever this issue is due to not wanting to ruin the "rapport" that the therapist, Bill's wife, and Anthony [name] reportedly have now. The therapist discusses the limits of confidentiality extensively with Anthony [name] making it clear that if Anthony [name] were to tell something that he did not want his wife or others to know that this therapist would not breach confidentiality and that the therapist has only been able to talk with Bill's wife during his recent crisis based on the ROI he signed previously and the tacit consent provided when he brought her in on the marital session Anthony [name] reiterates that this session only caused his wife to continue to believe that she is right as the therapist agreed with her. The therapist informs Anthony [name] that he did agree with Bill's wife on the exact same points that Anthony [name] also admitted he too could not disagree with. The therapist reminds Anthony [name] that he addressed Bill's concerns about his wife not respecting his privacy via telling family members about his problems. The therapist explains that at the time of the session that Anthony [name] had not said anything about his being upset with his wife's spending and her  perceived "hoarding" and that Anthony [name] has never openly disclosed to her that he is not happy with many things in the marriage.  Anthony [name] indicates that he does not want to do marital therapy seeing this as "separate" from his recovery; however, the therapist notes that recovery does not take place in a vacuum while pointing out the reason that family therapy is a required component of any evidence-based substance use treatment program. Anthony [name] says that he has too much invested in his marriage to leave it and that people either leave or must work things out. The therapist observes that Anthony [name] is presently doing nothing to work things out other than trying to listen to his wife more though he feels annoyed in doing so while remaining silent about his own complaints.  He concludes the session by informing this therapist that not every gets it I.e. recovery the first time; however, the therapist reminds Jaquane that given his recent near death experience with being admitted to ICU that he does not have an infinite amount of time in which to start getting this.   Thus, the session concludes with Anthony [name] wishing to hold onto whatever secret has caused him to feel triggered while declining therapy aimed at addressing a core issue that has been in place since his first meeting with this therapist; that is his extreme marital dissatisfaction.  Myrna Blazer, MA, LCSW, [facility], LCAS 04/09/2023

## 2023-04-10 ENCOUNTER — Ambulatory Visit (INDEPENDENT_AMBULATORY_CARE_PROVIDER_SITE_OTHER): Payer: 59

## 2023-04-10 DIAGNOSIS — F1020 Alcohol dependence, uncomplicated: Secondary | ICD-10-CM | POA: Diagnosis not present

## 2023-04-10 DIAGNOSIS — F39 Unspecified mood [affective] disorder: Secondary | ICD-10-CM

## 2023-04-10 NOTE — Progress Notes (Signed)
   Daily Group Progress Note   Program: CD IOP     Group Time: 10:00 a.m. to 12 p.m.    Type of Therapy: Process and Psychoeducational    Topic: The therapist checks in with group members, assesses for SI/HI/psychosis and overall level of functioning. The therapist inquires about sobriety date and number of community support meetings attended since last session.   Therapist discusses the following topics and prompts discussion on them: discussed self evaluation and gave PHQ-9 and GAD-7, asks group members if they felt they still needing to be in CD-IOP and if so what topics should be discussed in order to meet their needs, handed out Thirty -day Wonder from NA, distributed an article on  "Why You're Only as Sick as Location manager, gave choices of matrix topics to discuss which include Thoughts, Emotions and Behavior, Thought Stopping, Sex and Recovery, Repairing Relationships.     Summary: Anthony [name] rates his depression as a "2" and his anxiety as a "2". [name] reports the same sobriety date.  He says he is attending zoom meetings and checking in daily with his sponsor.   Anthony [name] says he went to an in person meeting last evening and asked his temporary sponsor to be his permanent sponsor and sponsor said he would. Anthony [name] says they are going to meet in person on Tuesday and Thursday and Anthony [name] will call his sponsor on other days. Anthony [name] says he will start step work with his sponsor.  Anthony [name] says he still wants to learn more about coping strategies in CD-IOP.  Anthony [name] says he really needs to go back to work. Anthony [name] says something that helped him in residential was a paper that was passe out that asked for people to recognize what was an excuse and what was a trigger. Anthony [name] discusses the video "Pleasure Unwoven"  and says he learned from this that addiction is an disease but it also points out where choice is involved.  In response to the article on "Secrets", Anthony [name] says if you tell one person your secret you do not know if it  will be confidential, as people talk.  Anthony [name] says why should you tell the other person about the secret if it will hurt them. Therapist discusses that if there is a secret in relationships then it is not an honest relationships and the person holding the secret operates from shame and such an emotional stance could result in behaviors that could cause further damage to the relationship.   Anthony [name] says he thinks the person holding the secret should hold the burden.  [name] scores a "3" on PHQ-9 and a "9" on the GAD-7.   Progress Towards Goals: Reports No alcohol or drugs.   UDS collected: no  Results: none   AA/NA attended?: Yes   Sponsor:  Yes   [name], MS LMFT, LCAS 04-10-23

## 2023-04-13 ENCOUNTER — Encounter (HOSPITAL_COMMUNITY): Payer: Self-pay | Admitting: Medical

## 2023-04-13 ENCOUNTER — Telehealth (HOSPITAL_COMMUNITY): Payer: Self-pay | Admitting: Licensed Clinical Social Worker

## 2023-04-13 ENCOUNTER — Ambulatory Visit (INDEPENDENT_AMBULATORY_CARE_PROVIDER_SITE_OTHER): Payer: 59 | Admitting: Medical

## 2023-04-13 DIAGNOSIS — F10931 Alcohol use, unspecified with withdrawal delirium: Secondary | ICD-10-CM

## 2023-04-13 DIAGNOSIS — F418 Other specified anxiety disorders: Secondary | ICD-10-CM

## 2023-04-13 DIAGNOSIS — F1020 Alcohol dependence, uncomplicated: Secondary | ICD-10-CM

## 2023-04-13 DIAGNOSIS — F329 Major depressive disorder, single episode, unspecified: Secondary | ICD-10-CM

## 2023-04-13 DIAGNOSIS — T1490XA Injury, unspecified, initial encounter: Secondary | ICD-10-CM

## 2023-04-13 DIAGNOSIS — F4312 Post-traumatic stress disorder, chronic: Secondary | ICD-10-CM

## 2023-04-13 DIAGNOSIS — F10231 Alcohol dependence with withdrawal delirium: Secondary | ICD-10-CM

## 2023-04-13 DIAGNOSIS — Z5329 Procedure and treatment not carried out because of patient's decision for other reasons: Secondary | ICD-10-CM

## 2023-04-13 DIAGNOSIS — F341 Dysthymic disorder: Secondary | ICD-10-CM

## 2023-04-13 DIAGNOSIS — Z566 Other physical and mental strain related to work: Secondary | ICD-10-CM

## 2023-04-13 DIAGNOSIS — F39 Unspecified mood [affective] disorder: Secondary | ICD-10-CM

## 2023-04-13 DIAGNOSIS — Z6372 Alcoholism and drug addiction in family: Secondary | ICD-10-CM

## 2023-04-13 DIAGNOSIS — Z811 Family history of alcohol abuse and dependence: Secondary | ICD-10-CM

## 2023-04-13 MED ORDER — BACLOFEN 10 MG PO TABS: 10.0000 mg | ORAL_TABLET | Freq: Three times a day (TID) | ORAL | 1 refills | Status: AC

## 2023-04-13 NOTE — Progress Notes (Signed)
THERAPIST PROGRESS NOTE  Session Time: 9:12am to 10:55 am  Type of Therapy: Individual   Therapist Response/Interventions: Therapist will assist Anthony [name] in identifying and avoiding triggers to drink while assisting him in overcoming barriers to obtaining a sober support system.  Treatment Goals addressed: Anthony [name] will abstain from drinking alcohol 90 out of 90 days as evidenced by weekly UDS and Anthony [name] will attend AA meetings no less than 3 meetings per week and will obtain a sponsor per self report  Topics: Discussed the following topics: addiction as medical disease and how feeling shame about his alcohol use is not congruent with viewing addiction as a disease, removing choice,asked what Anthony [name] saw as his biggest threat to sobriety, importance of having connections to community and not isolating, how aiming high will assist with achieving more than aiming low.  Summary: Anthony [name] presents for group today, however there were no other participants, so therapists meet with him for an individual session. Anthony [name] rates his depression as a "2" and his anxiety as a "4" today. He reports the same sobriety date, that being 03-08-23.  Anthony [name] says he continues to attend [employer]. Anthony [name] says he checks in via phone with his sponsor and meets him in person also. Anthony [name] says he has learned more from CD-IOP than from being in residential settings.  Anthony [name] says his depression is the biggest threat to his sobriety, but is now on medication for him depression and feels this will be helpful. Anthony [name] discusses how he has isolated himself and does not really have friends. Anthony [name] says he has experienced having friends judge him. Therapists point out that if friends are judging you, then they are no really friends.  Anthony [name] says he really needs to get back to work but appreciates everything he has learned in CD-IOP  Progress Towards Goals: progressed  Suicidal/Homicidal: denies  Plan: Anthony [name] will be stepping down from CD-IOP today. He says he plans  to see the P.A. for ongoing med management and continue to attend AA and obtain ongoing support from his sponsor. Therapists offered to meet with Anthony [name] for individual session if he changes his mind about that.  Diagnosis: Alcohol Dependence, Severe, Major Depressive Disorder  Collaboration of Care: N/A  Patient/Guardian was advised Release of Information must be obtained prior to any record release in order to collaborate their care with an outside provider. Patient/Guardian was advised if they have not already done so to contact the registration department to sign all necessary forms in order for [place] to release information regarding their care.   Consent: Patient/Guardian gives verbal consent for treatment and assignment of benefits for services provided during this visit. Patient/Guardian expressed understanding and agreed to proceed.   Anthony Eisenmenger, MS, LMFT, LCAS Anthony Mires, MA, LCSW, [facility], LCAS

## 2023-04-13 NOTE — Telephone Encounter (Signed)
The therapist calls Anthony [name] confirming his identity via two identifiers. Anthony [name] says that the Receptionist from [employer] apparently left a message on his wife's phone. The therapist provides Anthony [name] with the direct contact number for [employer] Receptionist asking that he return the call as the PA-C wants to see him on Thursday.  When [name] arrives on Thursday, he can complete his portion of the [employer] form and the PA-C can complete his portion.  Myrna Blazer, MA, LCSW, [facility], LCAS 04/13/2023
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
## 2023-04-15 ENCOUNTER — Ambulatory Visit (HOSPITAL_COMMUNITY): Payer: 59

## 2023-04-16 ENCOUNTER — Ambulatory Visit (HOSPITAL_COMMUNITY): Payer: 59 | Admitting: Medical

## 2023-04-16 ENCOUNTER — Encounter (HOSPITAL_COMMUNITY): Payer: Self-pay | Admitting: Medical

## 2023-04-16 VITALS — BP 134/80 | HR 66 | Ht 69.0 in | Wt 164.0 lb

## 2023-04-16 DIAGNOSIS — Z62819 Personal history of unspecified abuse in childhood: Secondary | ICD-10-CM

## 2023-04-16 DIAGNOSIS — F4312 Post-traumatic stress disorder, chronic: Secondary | ICD-10-CM

## 2023-04-16 DIAGNOSIS — F1020 Alcohol dependence, uncomplicated: Secondary | ICD-10-CM | POA: Diagnosis not present

## 2023-04-16 DIAGNOSIS — T1490XA Injury, unspecified, initial encounter: Secondary | ICD-10-CM

## 2023-04-16 DIAGNOSIS — F341 Dysthymic disorder: Secondary | ICD-10-CM | POA: Diagnosis not present

## 2023-04-16 DIAGNOSIS — Z6372 Alcoholism and drug addiction in family: Secondary | ICD-10-CM

## 2023-04-16 DIAGNOSIS — Z79899 Other long term (current) drug therapy: Secondary | ICD-10-CM

## 2023-04-16 MED ORDER — LAMOTRIGINE 100 MG PO TABS
100.0000 mg | ORAL_TABLET | Freq: Every day | ORAL | 0 refills | Status: AC
Start: 2023-04-16 — End: 2023-07-15

## 2023-04-16 NOTE — Progress Notes (Signed)
BH MD/PA/NP OP Progress Note  04/16/2023 2:24 PM Anthony [name]  MRN:  [id number]  Chief Complaint:  Chief Complaint  Patient presents with   Follow-up   Alcohol Problem   Trauma   Stress   Agitation   Family Problem   Medication Refill   HPI: Anthony [name] returns for CD IOP Aftercare Medication Management for his dual diagnosi-Cooccuring disorders of CPTSD and AUD Severe Dependence.  He reportshe has ben able tomaintain abstinence an is able to take his medications as prescribed. He continues to see Myrna Blazer LCSW for Aftercare counseling and to participate with a sponsor in [place].  Visit Diagnosis:    ICD-10-CM   1. Alcohol use disorder, severe, dependence (HCC)  F10.20     2. Trauma in childhood  T14.90XA     3. Dysfunctional family due to alcoholism  Z63.72     4. Early onset dysthymia  F34.1     5. Chronic post-traumatic stress disorder (PTSD)  F43.12     6. Medication management  Z79.899       Past Psychiatric History:  Alcohol use disorder, severe, dependence (HCC) Unspecified mood (affective) disorder (HCC) DTs (delirium tremens) (HCC) Procedure and treatment not carried out because of patient's decision for other reasons Dysfunctional family due to alcoholism Family history of alcoholism in father Early onset dysthymia Anxious depression Stressful job Chronic post-traumatic stress disorder (PTSD) Trauma in chilhood   Past Medical History:  Past Medical History:  Diagnosis Date   Alcohol withdrawal syndrome (HCC)    Alcoholism (HCC)    Anxiety    Depression    ED (erectile dysfunction)    GERD (gastroesophageal reflux disease)    Suicidal ideation    Tinnitus     Past Surgical History:  Procedure Laterality Date   LEG SURGERY Right    broken leg   ROTATOR CUFF REPAIR Bilateral     Family Psychiatric History:  Dad and paternal uncles had alcohol problems   Family History:  Family History  Problem Relation Age of Onset   Diabetes Mother      Social History:  Social History   Socioeconomic History   Marital status: Divorced    Spouse name: Not on file   Number of children: Not on file   Years of education: Not on file   Highest education level: Not on file  Occupational History   Occupation: electrician  Tobacco Use   Smoking status: Never   Smokeless tobacco: Never  Vaping Use   Vaping status: Never Used  Substance and Sexual Activity   Alcohol use: Yes    Comment: 3 per day   Drug use: Never   Sexual activity: Not on file  Other Topics Concern   Not on file  Social History Narrative   Patient is married he has a son born in 2010.   He is an Personnel officer at [employer]   He has alcoholism and is currently drinking   No tobacco or drug use   Social Determinants of Corporate investment banker Strain: Not on file  Food Insecurity: No Food Insecurity (03/04/2023)   Hunger Vital Sign    Worried About Running Out of Food in the Last Year: Never true    Ran Out of Food in the Last Year: Never true  Transportation Needs: No Transportation Needs (03/04/2023)   PRAPARE - Administrator, Civil Service (Medical): No    Lack of Transportation (Non-Medical): No  Physical Activity: Not  on file  Stress: Not on file  Social Connections: Unknown (11/19/2021)   Received from [facility]   Social Network    Social Network: Not on file    Allergies: No Known Allergies  Metabolic Disorder Labs: Lab Results  Component Value Date   HGBA1C 5.3 03/05/2023   MPG 105.41 03/05/2023   No results found for: "PROLACTIN" Lab Results  Component Value Date   CHOL 183 03/05/2023   TRIG 54 03/05/2023   HDL 70 03/05/2023   CHOLHDL 2.6 03/05/2023   VLDL 11 03/05/2023   LDLCALC 102 (H) 03/05/2023   Lab Results  Component Value Date   TSH 2.350 03/06/2023    Therapeutic Level Labs: No results found for: "LITHIUM" No results found for: "VALPROATE" No results found for: "CBMZ"  Current  Medications: Current Outpatient Medications  Medication Sig Dispense Refill   lamoTRIgine (LAMICTAL) 100 MG tablet Take 1 tablet (100 mg total) by mouth daily. 90 tablet 0   baclofen (LIORESAL) 10 MG tablet Take 1 tablet (10 mg total) by mouth 3 (three) times daily. 90 tablet 1   buPROPion (WELLBUTRIN XL) 150 MG 24 hr tablet Take 1 tablet (150 mg total) by mouth every morning. 30 tablet 2   docusate sodium (COLACE) 100 MG capsule Take 1 capsule (100 mg total) by mouth 2 (two) times daily as needed for mild constipation. 10 capsule 0   folic acid (FOLVITE) 1 MG tablet Take 1 tablet (1 mg total) by mouth daily.     hydrOXYzine (ATARAX) 25 MG tablet Take 1 tablet (25 mg total) by mouth 3 (three) times daily as needed for anxiety. 30 tablet 0   losartan (COZAAR) 25 MG tablet Take 1 tablet (25 mg total) by mouth daily.     Multiple Vitamin (MULTIVITAMIN WITH MINERALS) TABS tablet Take 1 tablet by mouth daily. 30 tablet 0   naltrexone (DEPADE) 50 MG tablet Take 1 tablet (50 mg total) by mouth daily. 30 tablet 0   polyethylene glycol (MIRALAX / GLYCOLAX) 17 g packet Take 17 g by mouth daily as needed for moderate constipation. 14 each 0   thiamine (VITAMIN B-1) 100 MG tablet Take 1 tablet (100 mg total) by mouth daily. 30 tablet 0   No current facility-administered medications for this visit.     Musculoskeletal: Strength & Muscle Tone: within normal limits Gait & Station: normal Patient leans: N/A  Psychiatric Specialty Exam: Review of Systems  Constitutional:  Negative for activity change (CONTINUES SOBRIETY), appetite change, chills, diaphoresis, fatigue, fever and unexpected weight change.  HENT:  Positive for hearing loss, sinus pressure, sore throat and tinnitus (SEEMS TO HAVE INCREASED). Negative for congestion, dental problem, postnasal drip, rhinorrhea, sinus pain, sneezing, trouble swallowing and voice change.   Gastrointestinal:  Positive for constipation (rX MEDS FOR SAME-NO  COMPLAINT). Negative for abdominal distention, abdominal pain, anal bleeding, blood in stool, diarrhea, nausea, rectal pain and vomiting.  Endocrine: Negative for cold intolerance, heat intolerance, polydipsia, polyphagia and polyuria.  Neurological:  Negative for dizziness, tremors, seizures, syncope, facial asymmetry, speech difficulty, weakness, light-headedness, numbness and headaches.  Psychiatric/Behavioral:  Positive for agitation (IMPROVED EXCEPT WITH "TEDIOUS TASK") and dysphoric mood. Negative for behavioral problems, confusion and decreased concentration.   All other systems reviewed and are negative.   Blood pressure 134/80, pulse 66, height 5\' 9"  (1.753 m), weight 164 lb (74.4 kg).Body mass index is 24.22 kg/m.  General Appearance: Casual and Neat  Eye Contact:  Good  Speech:  Clear  and Coherent and Normal Rate  Volume:  Normal  Mood:  Some Anxiety mostlyEuthymic  Affect:  Appropriate and Congruent  Thought Process:  Coherent, Goal Directed, and Descriptions of Associations: Intact  Orientation:  Full (Time, Place, and Person)  Thought Content:  WDL    Suicidal Thoughts:  No  Homicidal Thoughts:  No  Memory:   Trauma informed  Judgement:  Impaired  Insight:  Lacking  Psychomotor Activity:  Normal  Concentration:  Concentration: Good and Attention Span: Good  Recall:   Intact/see memory  Fund of Knowledge:  WDL  Language: Good  Akathisia:  NA  Handed:  Right  AIMS (if indicated): NA  Assets:  Desire for Improvement Financial Resources/Insurance Housing Resilience Social Support Talents/Skills Transportation Vocational/Educational  ADL's:  Intact  Cognition: Impaired,  Mild and Moderate (PTSD)  Sleep:  Good   Screenings: AUDIT    Flowsheet Row [facility]  Alcohol Use Disorder Identification Test Final Score (AUDIT) 34      GAD-7    Flowsheet Row Counselor from 04/10/2023 in  [facility] Counselor from 03/27/2023 in [facility] Counselor from 01/12/2023 in [facility]  Total GAD-7 Score 9 10 11       PHQ2-9    Flowsheet Row Counselor from 04/10/2023 in [facility] Counselor from 03/27/2023 in [facility] Counselor from 01/12/2023 in [facility]  PHQ-2 Total Score 1 2 3   PHQ-9 Total Score 3 6 7       Flowsheet Row Ad[facility] ([facility]) [facility] from [facility]24/2023 in [facility] ED from 02/20/2023 in [facility] Department [facility]  C-SSRS RISK CATEGORY No Risk No Risk No Risk        Assessment : Marked improvement    and Plan: Continue meds and FU with Counselor and AA/Sponsor FU 3 mos here/sooner if needed    [name], PA-C 04/16/2023, 2:24 PM

## 2023-04-17 ENCOUNTER — Ambulatory Visit (HOSPITAL_COMMUNITY): Payer: 59

## 2023-04-20 ENCOUNTER — Ambulatory Visit (HOSPITAL_COMMUNITY): Payer: 59

## 2023-04-22 ENCOUNTER — Ambulatory Visit (HOSPITAL_COMMUNITY): Payer: 59

## 2023-04-24 ENCOUNTER — Ambulatory Visit (HOSPITAL_COMMUNITY): Payer: 59

## 2023-04-27 ENCOUNTER — Ambulatory Visit (HOSPITAL_COMMUNITY): Payer: 59

## 2023-04-29 ENCOUNTER — Ambulatory Visit (HOSPITAL_COMMUNITY): Payer: 59

## 2023-05-01 ENCOUNTER — Ambulatory Visit (HOSPITAL_COMMUNITY): Payer: 59

## 2023-07-23 ENCOUNTER — Ambulatory Visit (HOSPITAL_COMMUNITY): Payer: 59 | Admitting: Medical
